# Patient Record
Sex: Female | Born: 1960
Health system: Southern US, Community
[De-identification: ages and names within clinical notes are randomized; demographics above are authoritative.]

## PROBLEM LIST (undated history)

## (undated) DIAGNOSIS — I10 Essential (primary) hypertension: Secondary | ICD-10-CM

## (undated) HISTORY — PX: EXPLORATORY LAPAROTOMY: SUR591

## (undated) HISTORY — PX: CHOLECYSTECTOMY: SHX55

---

## 2015-03-23 DIAGNOSIS — I639 Cerebral infarction, unspecified: Secondary | ICD-10-CM

## 2015-03-23 HISTORY — DX: Cerebral infarction, unspecified: I63.9

## 2016-07-05 DIAGNOSIS — H8110 Benign paroxysmal vertigo, unspecified ear: Secondary | ICD-10-CM | POA: Diagnosis not present

## 2016-07-05 DIAGNOSIS — Z8673 Personal history of transient ischemic attack (TIA), and cerebral infarction without residual deficits: Secondary | ICD-10-CM | POA: Diagnosis not present

## 2016-07-05 DIAGNOSIS — R42 Dizziness and giddiness: Secondary | ICD-10-CM | POA: Diagnosis not present

## 2016-07-05 DIAGNOSIS — Z1159 Encounter for screening for other viral diseases: Secondary | ICD-10-CM | POA: Diagnosis not present

## 2016-07-05 DIAGNOSIS — Z0000 Encounter for general adult medical examination without abnormal findings: Secondary | ICD-10-CM | POA: Diagnosis not present

## 2016-07-16 DIAGNOSIS — Z8673 Personal history of transient ischemic attack (TIA), and cerebral infarction without residual deficits: Secondary | ICD-10-CM | POA: Diagnosis not present

## 2016-07-16 DIAGNOSIS — N63 Unspecified lump in unspecified breast: Secondary | ICD-10-CM | POA: Diagnosis not present

## 2016-07-16 DIAGNOSIS — N6321 Unspecified lump in the left breast, upper outer quadrant: Secondary | ICD-10-CM | POA: Diagnosis not present

## 2016-07-16 DIAGNOSIS — H8110 Benign paroxysmal vertigo, unspecified ear: Secondary | ICD-10-CM | POA: Diagnosis not present

## 2016-07-16 DIAGNOSIS — Z1231 Encounter for screening mammogram for malignant neoplasm of breast: Secondary | ICD-10-CM | POA: Diagnosis not present

## 2016-07-16 DIAGNOSIS — R922 Inconclusive mammogram: Secondary | ICD-10-CM | POA: Diagnosis not present

## 2016-10-11 DIAGNOSIS — I10 Essential (primary) hypertension: Secondary | ICD-10-CM | POA: Diagnosis not present

## 2016-10-11 DIAGNOSIS — H6092 Unspecified otitis externa, left ear: Secondary | ICD-10-CM | POA: Diagnosis not present

## 2016-10-11 DIAGNOSIS — R42 Dizziness and giddiness: Secondary | ICD-10-CM | POA: Diagnosis not present

## 2016-10-11 DIAGNOSIS — Z23 Encounter for immunization: Secondary | ICD-10-CM | POA: Diagnosis not present

## 2016-10-25 DIAGNOSIS — K5900 Constipation, unspecified: Secondary | ICD-10-CM | POA: Diagnosis not present

## 2016-10-25 DIAGNOSIS — Z1211 Encounter for screening for malignant neoplasm of colon: Secondary | ICD-10-CM | POA: Diagnosis not present

## 2016-11-12 DIAGNOSIS — Z1211 Encounter for screening for malignant neoplasm of colon: Secondary | ICD-10-CM | POA: Diagnosis not present

## 2016-11-12 DIAGNOSIS — K5730 Diverticulosis of large intestine without perforation or abscess without bleeding: Secondary | ICD-10-CM | POA: Diagnosis not present

## 2016-11-12 DIAGNOSIS — K648 Other hemorrhoids: Secondary | ICD-10-CM | POA: Diagnosis not present

## 2017-01-14 DIAGNOSIS — N6002 Solitary cyst of left breast: Secondary | ICD-10-CM | POA: Diagnosis not present

## 2017-01-14 DIAGNOSIS — N63 Unspecified lump in unspecified breast: Secondary | ICD-10-CM | POA: Diagnosis not present

## 2017-05-06 DIAGNOSIS — G44309 Post-traumatic headache, unspecified, not intractable: Secondary | ICD-10-CM | POA: Diagnosis not present

## 2017-05-06 DIAGNOSIS — J329 Chronic sinusitis, unspecified: Secondary | ICD-10-CM | POA: Diagnosis not present

## 2017-05-06 DIAGNOSIS — J40 Bronchitis, not specified as acute or chronic: Secondary | ICD-10-CM | POA: Diagnosis not present

## 2017-09-28 DIAGNOSIS — G44309 Post-traumatic headache, unspecified, not intractable: Secondary | ICD-10-CM | POA: Diagnosis not present

## 2017-09-28 DIAGNOSIS — I10 Essential (primary) hypertension: Secondary | ICD-10-CM | POA: Diagnosis not present

## 2017-09-28 DIAGNOSIS — E785 Hyperlipidemia, unspecified: Secondary | ICD-10-CM | POA: Diagnosis not present

## 2017-11-08 DIAGNOSIS — I10 Essential (primary) hypertension: Secondary | ICD-10-CM | POA: Diagnosis not present

## 2017-11-08 DIAGNOSIS — R05 Cough: Secondary | ICD-10-CM | POA: Diagnosis not present

## 2017-11-08 DIAGNOSIS — E785 Hyperlipidemia, unspecified: Secondary | ICD-10-CM | POA: Diagnosis not present

## 2017-12-28 DIAGNOSIS — G44309 Post-traumatic headache, unspecified, not intractable: Secondary | ICD-10-CM | POA: Diagnosis not present

## 2018-01-25 DIAGNOSIS — G44309 Post-traumatic headache, unspecified, not intractable: Secondary | ICD-10-CM | POA: Diagnosis not present

## 2020-12-12 ENCOUNTER — Other Ambulatory Visit (HOSPITAL_COMMUNITY): Payer: Self-pay | Admitting: Registered Nurse

## 2020-12-12 ENCOUNTER — Inpatient Hospital Stay (HOSPITAL_BASED_OUTPATIENT_CLINIC_OR_DEPARTMENT_OTHER)
Admission: EM | Admit: 2020-12-12 | Discharge: 2020-12-16 | DRG: 041 | Disposition: A | Discharge status: Home / Self Care | Payer: Commercial Managed Care - PPO | Attending: Internal Medicine | Admitting: Internal Medicine

## 2020-12-12 ENCOUNTER — Other Ambulatory Visit (HOSPITAL_COMMUNITY): Payer: Self-pay | Admitting: Family Medicine

## 2020-12-12 ENCOUNTER — Encounter (HOSPITAL_BASED_OUTPATIENT_CLINIC_OR_DEPARTMENT_OTHER): Payer: Self-pay

## 2020-12-12 ENCOUNTER — Ambulatory Visit (HOSPITAL_BASED_OUTPATIENT_CLINIC_OR_DEPARTMENT_OTHER)
Admission: RE | Admit: 2020-12-12 | Discharge: 2020-12-12 | Disposition: A | Discharge status: Home / Self Care | Payer: Commercial Managed Care - PPO | Source: Ambulatory Visit | Attending: Family Medicine | Admitting: Family Medicine

## 2020-12-12 ENCOUNTER — Other Ambulatory Visit: Payer: Self-pay

## 2020-12-12 DIAGNOSIS — I4891 Unspecified atrial fibrillation: Secondary | ICD-10-CM | POA: Diagnosis present

## 2020-12-12 DIAGNOSIS — R29818 Other symptoms and signs involving the nervous system: Secondary | ICD-10-CM

## 2020-12-12 DIAGNOSIS — I371 Nonrheumatic pulmonary valve insufficiency: Secondary | ICD-10-CM | POA: Diagnosis present

## 2020-12-12 DIAGNOSIS — I10 Essential (primary) hypertension: Secondary | ICD-10-CM | POA: Diagnosis present

## 2020-12-12 DIAGNOSIS — Z20822 Contact with and (suspected) exposure to covid-19: Secondary | ICD-10-CM | POA: Diagnosis present

## 2020-12-12 DIAGNOSIS — Z7902 Long term (current) use of antithrombotics/antiplatelets: Secondary | ICD-10-CM

## 2020-12-12 DIAGNOSIS — E669 Obesity, unspecified: Secondary | ICD-10-CM | POA: Diagnosis present

## 2020-12-12 DIAGNOSIS — Z6836 Body mass index (BMI) 36.0-36.9, adult: Secondary | ICD-10-CM

## 2020-12-12 DIAGNOSIS — Z800 Family history of malignant neoplasm of digestive organs: Secondary | ICD-10-CM

## 2020-12-12 DIAGNOSIS — G8191 Hemiplegia, unspecified affecting right dominant side: Secondary | ICD-10-CM | POA: Diagnosis present

## 2020-12-12 DIAGNOSIS — I351 Nonrheumatic aortic (valve) insufficiency: Secondary | ICD-10-CM | POA: Diagnosis present

## 2020-12-12 DIAGNOSIS — R29701 NIHSS score 1: Secondary | ICD-10-CM | POA: Diagnosis present

## 2020-12-12 DIAGNOSIS — E785 Hyperlipidemia, unspecified: Secondary | ICD-10-CM | POA: Diagnosis present

## 2020-12-12 DIAGNOSIS — G8929 Other chronic pain: Secondary | ICD-10-CM | POA: Diagnosis present

## 2020-12-12 DIAGNOSIS — Z7982 Long term (current) use of aspirin: Secondary | ICD-10-CM

## 2020-12-12 DIAGNOSIS — I63422 Cerebral infarction due to embolism of left anterior cerebral artery: Secondary | ICD-10-CM | POA: Diagnosis not present

## 2020-12-12 DIAGNOSIS — Q211 Atrial septal defect: Secondary | ICD-10-CM

## 2020-12-12 DIAGNOSIS — Z8673 Personal history of transient ischemic attack (TIA), and cerebral infarction without residual deficits: Secondary | ICD-10-CM

## 2020-12-12 DIAGNOSIS — Z9049 Acquired absence of other specified parts of digestive tract: Secondary | ICD-10-CM

## 2020-12-12 DIAGNOSIS — E119 Type 2 diabetes mellitus without complications: Secondary | ICD-10-CM | POA: Diagnosis present

## 2020-12-12 DIAGNOSIS — Z713 Dietary counseling and surveillance: Secondary | ICD-10-CM

## 2020-12-12 DIAGNOSIS — I358 Other nonrheumatic aortic valve disorders: Secondary | ICD-10-CM | POA: Diagnosis present

## 2020-12-12 DIAGNOSIS — I639 Cerebral infarction, unspecified: Secondary | ICD-10-CM | POA: Diagnosis not present

## 2020-12-12 HISTORY — DX: Essential (primary) hypertension: I10

## 2020-12-12 LAB — BASIC METABOLIC PANEL
Anion gap: 10 (ref 5–15)
BUN: 12 mg/dL (ref 8–23)
CO2: 25 mmol/L (ref 22–32)
Calcium: 9.9 mg/dL (ref 8.9–10.3)
Chloride: 103 mmol/L (ref 98–111)
Creatinine, Ser: 0.67 mg/dL (ref 0.44–1.00)
GFR, Estimated: >60 mL/min (ref >60)
Glucose, Bld: 88 mg/dL (ref 70–99)
Potassium: 3.9 mmol/L (ref 3.5–5.1)
Sodium: 138 mmol/L (ref 135–145)

## 2020-12-12 LAB — CBC WITH DIFFERENTIAL/PLATELET
Abs Immature Granulocytes: 0.04 10*3/uL (ref 0.00–0.07)
Basophils Absolute: 0.1 10*3/uL (ref 0.0–0.1)
Basophils Relative: 1 %
Eosinophils Absolute: 0.2 10*3/uL (ref 0.0–0.5)
Eosinophils Relative: 2 %
HCT: 43.6 % (ref 36.0–46.0)
Hemoglobin: 14.4 g/dL (ref 12.0–15.0)
Immature Granulocytes: 0 %
Lymphocytes Relative: 33 %
Lymphs Abs: 3.5 10*3/uL (ref 0.7–4.0)
MCH: 29 pg (ref 26.0–34.0)
MCHC: 33 g/dL (ref 30.0–36.0)
MCV: 87.7 fL (ref 80.0–100.0)
Monocytes Absolute: 0.6 10*3/uL (ref 0.1–1.0)
Monocytes Relative: 6 %
Neutro Abs: 6.2 10*3/uL (ref 1.7–7.7)
Neutrophils Relative %: 58 %
Platelets: 317 10*3/uL (ref 150–400)
RBC: 4.97 MIL/uL (ref 3.87–5.11)
RDW: 12.7 % (ref 11.5–15.5)
WBC: 10.6 10*3/uL — ABNORMAL HIGH (ref 4.0–10.5)
nRBC: 0 % (ref 0.0–0.2)

## 2020-12-12 LAB — RESP PANEL BY RT-PCR (FLU A&B, COVID) ARPGX2
Influenza A by PCR: NEGATIVE
Influenza B by PCR: NEGATIVE
SARS Coronavirus 2 by RT PCR: NEGATIVE

## 2020-12-12 IMAGING — CT CT HEAD W/O CM
4 series · 16 of 47 positions shown, 18 images · non-contrast
Comparison: None.

CLINICAL DATA: Right arm and leg numbness and heaviness for 1
month, increasingly frequent episodes

EXAM:
CT HEAD WITHOUT CONTRAST
TECHNIQUE: Contiguous axial images were obtained from the base of the skull
through the vertex without intravenous contrast.

[Series 2: head wo · axial · 0.41mm/px · z∈[-218,-103]mm · 7 of 31 slices shown, 9 images]
[im 4/31  brain]
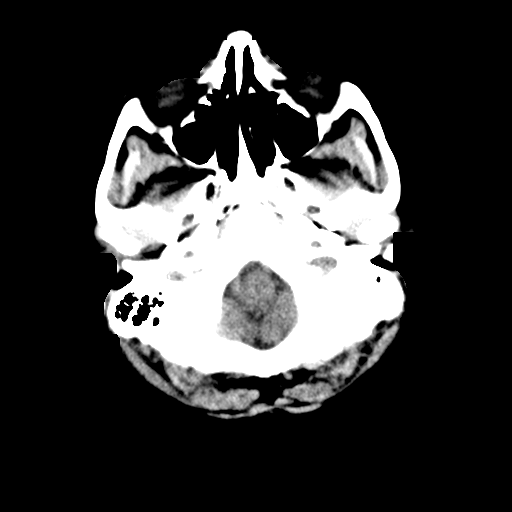
[im 4/31  bone]
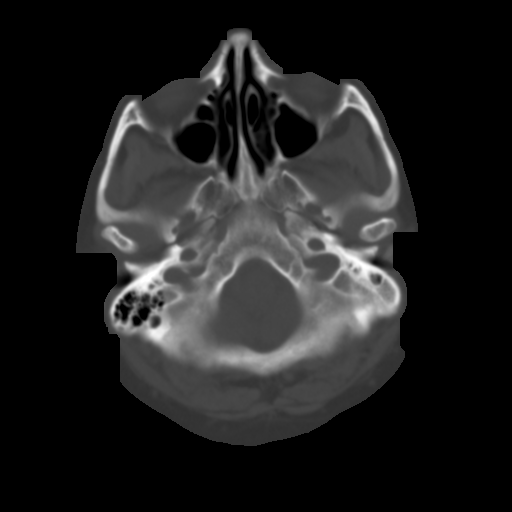
[im 8/31  brain]
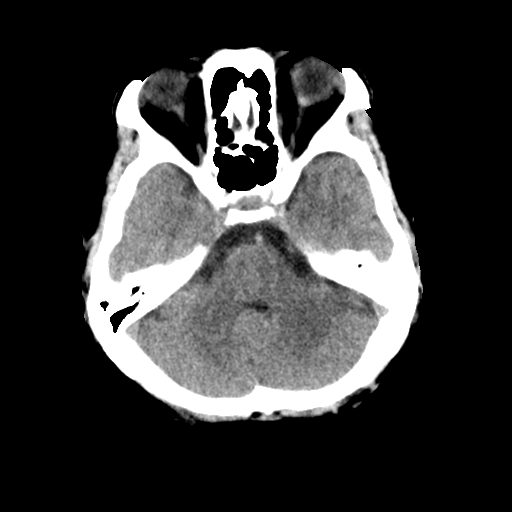
[im 12/31  brain]
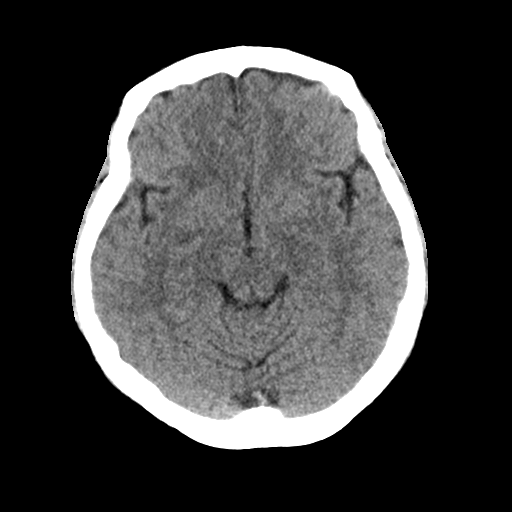
[im 16/31  brain]
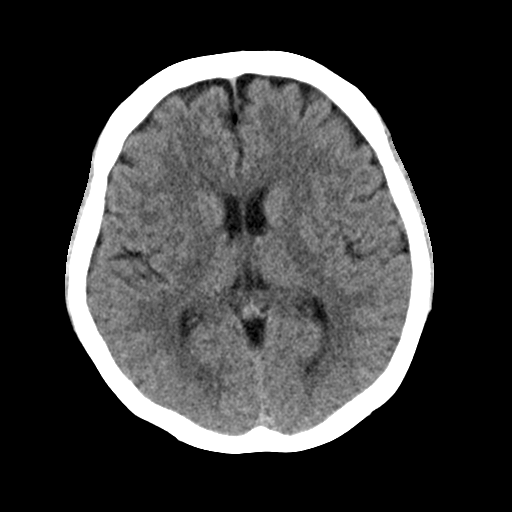
[im 19/31  brain]
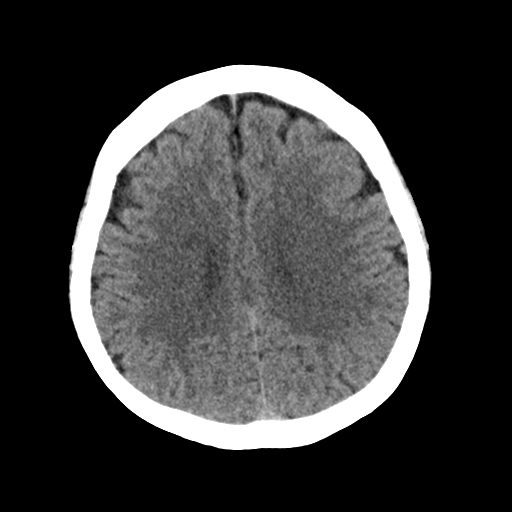
[im 19/31  bone]
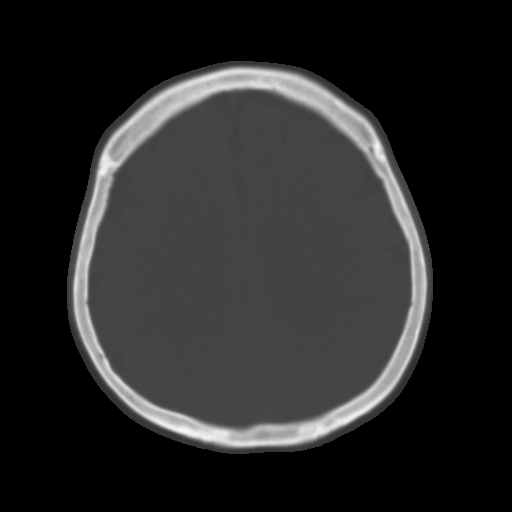
[im 23/31  brain]
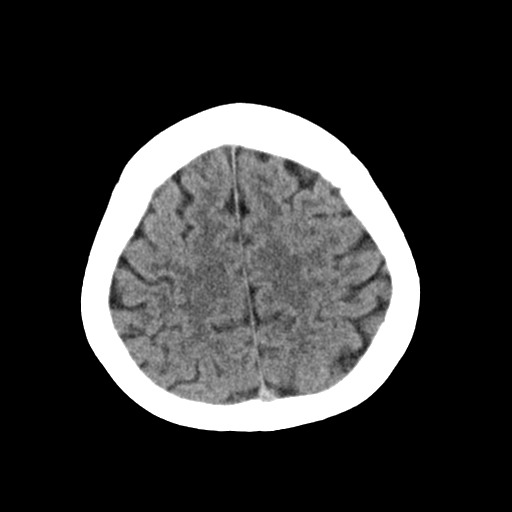
[im 27/31  brain]
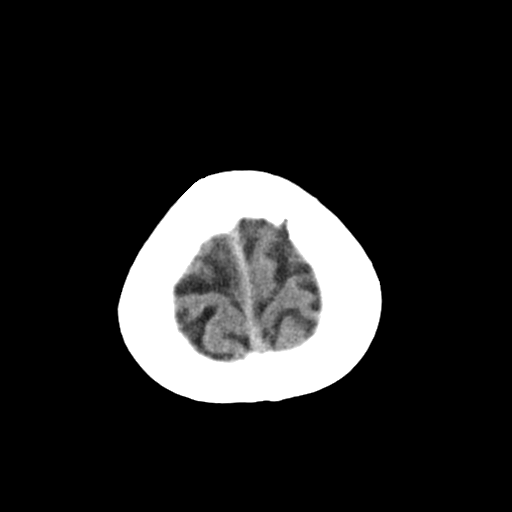

[Series 3: head bone · axial · 0.41mm/px · z∈[-219,-189]mm · 3 of 77 slices shown]
[im 8/77  bone]
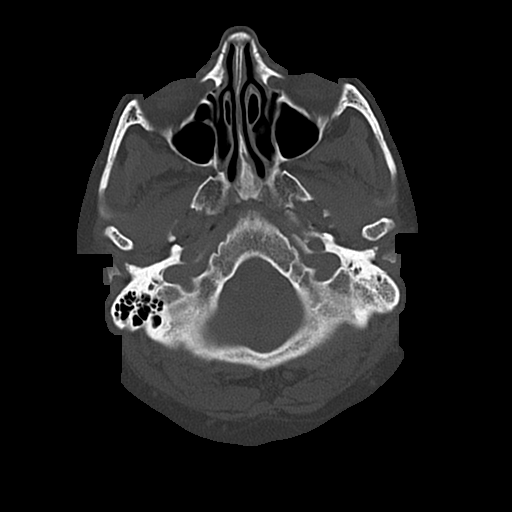
[im 16/77  bone]
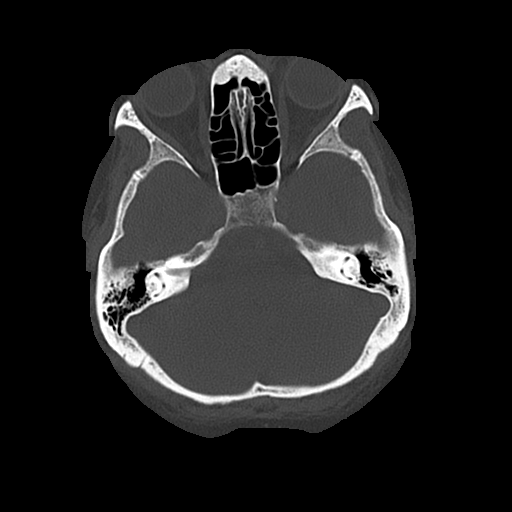
[im 23/77  bone]
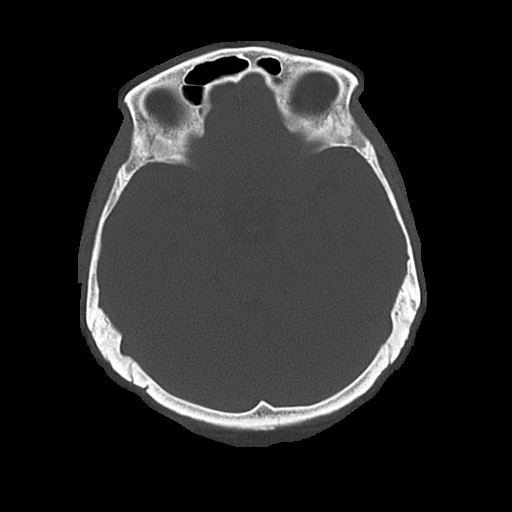

[Series 4: coronal soft · coronal · 0.30mm/px · 3 of 61 slices shown]
[im 21/61  brain]
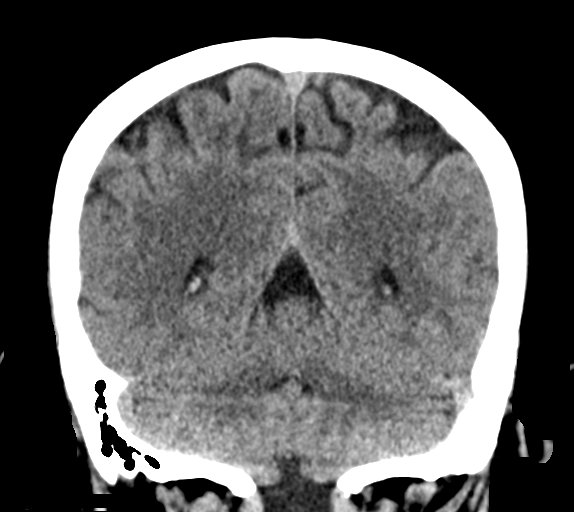
[im 27/61  brain]
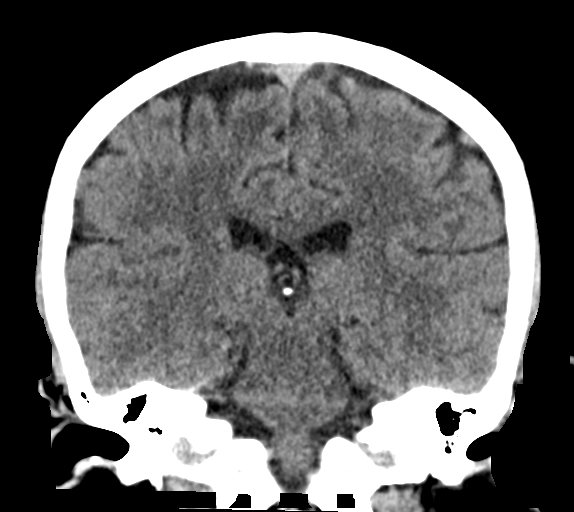
[im 34/61  brain]
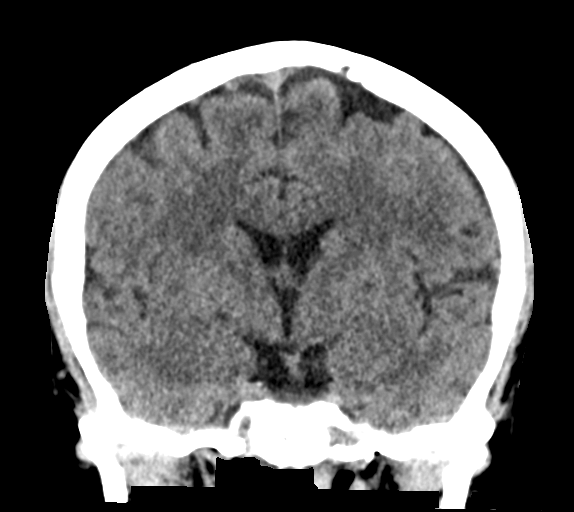

[Series 5: sagittal soft · sagittal · 0.31mm/px · 3 of 59 slices shown]
[im 20/59  brain]
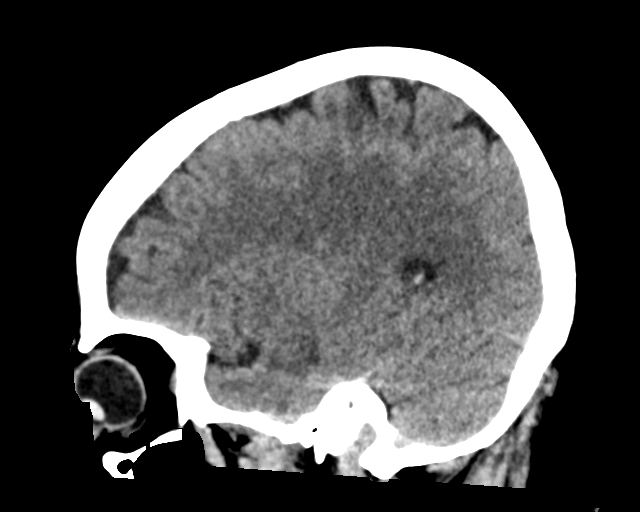
[im 30/59  brain]
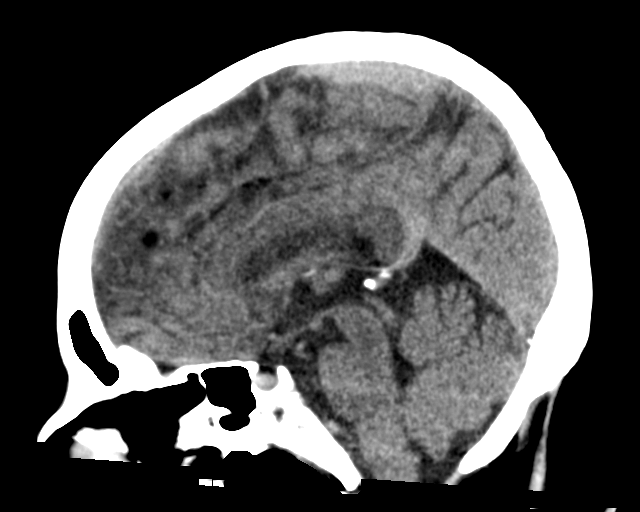
[im 39/59  brain]
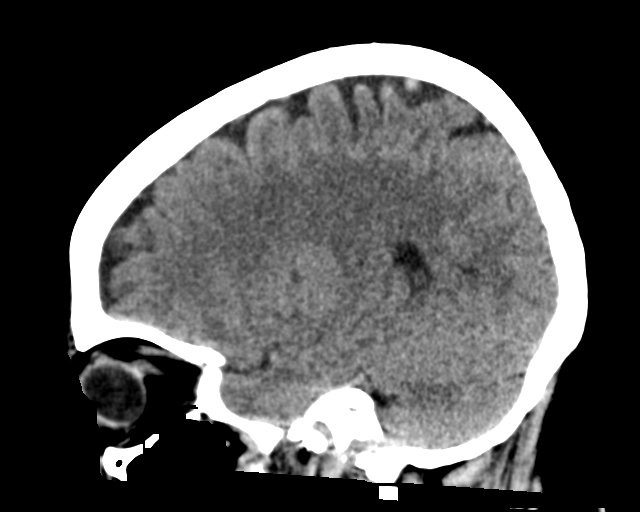

[16 of 47 positions shown; findings below may reference images not displayed]

FINDINGS: Brain: No evidence of acute infarction, hemorrhage, hydrocephalus,
extra-axial collection or mass lesion/mass effect. Small, likely
nonacute lacunar infarction of the right corona radiata (series 2,
image 18)

Vascular: No hyperdense vessel or unexpected calcification.

Skull: Normal. Negative for fracture or focal lesion.

Sinuses/Orbits: No acute finding.

Other: None.
IMPRESSION: 1. No acute intracranial pathology.
2. Small, likely nonacute lacunar infarction of the right corona
radiata.
3. Consider MRI to more sensitively evaluate for acute diffusion
restricting infarction if clinically suspected.

These results will be called to the ordering clinician or
representative by the Radiologist Assistant, and communication
documented in the PACS or [REDACTED].

## 2020-12-12 MED ORDER — ASPIRIN 81 MG PO CHEW
162.0000 mg | CHEWABLE_TABLET | Freq: Once | ORAL | Status: AC
  Administered 2020-12-12: 162 mg via ORAL
  Filled 2020-12-12: qty 2

## 2020-12-12 NOTE — ED Triage Notes (Signed)
Pt arrives POV with c/o numbness to right arm and right leg from knee down.  States numbness is intermittent and she has had it "awhile".  Pt was sent by her provider for CT of head.

## 2020-12-12 NOTE — ED Provider Notes (Signed)
[facility] EMERGENCY DEPT Provider Note   CSN: [id number] Arrival date & time: 12/12/20  1503     History Chief Complaint  Patient presents with   Numbness    Mary Fritz is a 60 y.o. female.  Patient presents with weakness and tingling to the right upper extremity and right lower extremity.  Symptoms been ongoing for the last 5 days.  She saw her primary care doctor who ordered an outpatient CT scan of the brain that was done today.  Otherwise denies any fevers or cough no vomiting or diarrhea.  Denies any chest pain or abdominal pain.      Past Medical History:  Diagnosis Date   CVA (cerebral vascular accident) (HCC) 2017   Hypertension     There are no problems to display for this patient.   OB History   No obstetric history on file.     No family history on file.  Social History   Tobacco Use   Smoking status: Never   Smokeless tobacco: Never  Vaping Use   Vaping Use: Never used  Substance Use Topics   Alcohol use: Never   Drug use: Never    Home Medications Prior to Admission medications   Medication Sig Start Date End Date Taking? Authorizing Provider  clopidogrel (PLAVIX) 75 MG tablet Take 75 mg by mouth daily.   Yes [provider]    Allergies    Patient has no known allergies.  Review of Systems   Review of Systems  Constitutional:  Negative for fever.  HENT:  Negative for ear pain.   Eyes:  Negative for pain.  Respiratory:  Negative for cough.   Cardiovascular:  Negative for chest pain.  Gastrointestinal:  Negative for abdominal pain.  Genitourinary:  Negative for flank pain.  Musculoskeletal:  Negative for back pain.  Skin:  Negative for rash.  Neurological:  Negative for headaches.   Physical Exam Updated Vital Signs BP (!) 157/69   Pulse 60   Temp 98.5 F (36.9 C)   Resp 18   SpO2 97%   Physical Exam Constitutional:      General: She is not in acute distress.    Appearance: Normal appearance.   HENT:     Head: Normocephalic.     Nose: Nose normal.  Eyes:     Extraocular Movements: Extraocular movements intact.  Cardiovascular:     Rate and Rhythm: Normal rate.  Pulmonary:     Effort: Pulmonary effort is normal.  Musculoskeletal:        General: Normal range of motion.     Cervical back: Normal range of motion.  Neurological:     Mental Status: She is alert.     Comments: Tingling numbness sensation in the right upper extremity and right lower extremity present.  However no focal weakness noted 5/5 strength all extremities.  Cranial nerves II through XII intact.     ED Results / Procedures / Treatments   Labs (all labs ordered are listed, but only abnormal results are displayed) Labs Reviewed  CBC WITH DIFFERENTIAL/PLATELET - Abnormal; Notable for the following components:      Result Value   WBC 10.6 (*)    All other components within normal limits  RESP PANEL BY RT-PCR (FLU A&B, COVID) ARPGX2  BASIC METABOLIC PANEL    EKG None  Radiology CT HEAD WO CONTRAST ( )  Result Date: 12/12/2020 CLINICAL DATA:  Right arm and leg numbness and heaviness for 1 month, increasingly frequent episodes  EXAM: CT HEAD WITHOUT CONTRAST TECHNIQUE: Contiguous axial images were obtained from the base of the skull through the vertex without intravenous contrast. COMPARISON:  None. FINDINGS: Brain: No evidence of acute infarction, hemorrhage, hydrocephalus, extra-axial collection or mass lesion/mass effect. Small, likely nonacute lacunar infarction of the right corona radiata (series 2, image 18) Vascular: No hyperdense vessel or unexpected calcification. Skull: Normal. Negative for fracture or focal lesion. Sinuses/Orbits: No acute finding. Other: None. IMPRESSION: 1. No acute intracranial pathology. 2. Small, likely nonacute lacunar infarction of the right corona radiata. 3. Consider MRI to more sensitively evaluate for acute diffusion restricting infarction if clinically suspected. These  results will be called to the ordering clinician or representative by the Radiologist Assistant, and communication documented in the PACS or [employer]. Electronically Signed   By: Lauralyn Primes M.D.   On: 12/12/2020 14:34    Procedures Procedures   Medications Ordered in ED Medications  aspirin chewable tablet 162 mg (has no administration in time range)    ED Course  I have reviewed the triage vital signs and the nursing notes.  Pertinent labs & imaging results that were available during my care of the patient were reviewed by me and considered in my medical decision making (see chart for details).    MDM Rules/Calculators/A&P                           Labs are sent and chemistry remarkable.  CT scan concerning for right corona radiata small infarct, unknown duration thought to be likely nonacute per radiology.  Acuity of the infarct on CT scan appears in question.  Patient symptoms ongoing for about 5 days now.  Labs are sent.  Patient to be transferred to [facility] for MRI and observation.  Case discussed with neurology Dr.Collins.  Final Clinical Impression(s) / ED Diagnoses Final diagnoses:  Cerebrovascular accident (CVA), unspecified mechanism [facility])    Rx / DC Orders ED Discharge Orders     None        Cheryll Cockayne, MD 12/12/20 2018

## 2020-12-13 ENCOUNTER — Observation Stay (HOSPITAL_COMMUNITY): Payer: Commercial Managed Care - PPO

## 2020-12-13 ENCOUNTER — Encounter (HOSPITAL_COMMUNITY): Payer: Self-pay | Admitting: Internal Medicine

## 2020-12-13 DIAGNOSIS — I639 Cerebral infarction, unspecified: Secondary | ICD-10-CM

## 2020-12-13 DIAGNOSIS — Z800 Family history of malignant neoplasm of digestive organs: Secondary | ICD-10-CM | POA: Diagnosis not present

## 2020-12-13 DIAGNOSIS — Z8673 Personal history of transient ischemic attack (TIA), and cerebral infarction without residual deficits: Secondary | ICD-10-CM | POA: Diagnosis not present

## 2020-12-13 DIAGNOSIS — Z7982 Long term (current) use of aspirin: Secondary | ICD-10-CM | POA: Diagnosis not present

## 2020-12-13 DIAGNOSIS — Z7902 Long term (current) use of antithrombotics/antiplatelets: Secondary | ICD-10-CM | POA: Diagnosis not present

## 2020-12-13 DIAGNOSIS — G8929 Other chronic pain: Secondary | ICD-10-CM | POA: Diagnosis not present

## 2020-12-13 DIAGNOSIS — Z6836 Body mass index (BMI) 36.0-36.9, adult: Secondary | ICD-10-CM | POA: Diagnosis not present

## 2020-12-13 DIAGNOSIS — Z713 Dietary counseling and surveillance: Secondary | ICD-10-CM | POA: Diagnosis not present

## 2020-12-13 DIAGNOSIS — I371 Nonrheumatic pulmonary valve insufficiency: Secondary | ICD-10-CM | POA: Diagnosis not present

## 2020-12-13 DIAGNOSIS — R29701 NIHSS score 1: Secondary | ICD-10-CM | POA: Diagnosis not present

## 2020-12-13 DIAGNOSIS — I63422 Cerebral infarction due to embolism of left anterior cerebral artery: Secondary | ICD-10-CM | POA: Diagnosis not present

## 2020-12-13 DIAGNOSIS — Z9049 Acquired absence of other specified parts of digestive tract: Secondary | ICD-10-CM | POA: Diagnosis not present

## 2020-12-13 DIAGNOSIS — I351 Nonrheumatic aortic (valve) insufficiency: Secondary | ICD-10-CM | POA: Diagnosis not present

## 2020-12-13 DIAGNOSIS — I10 Essential (primary) hypertension: Secondary | ICD-10-CM | POA: Diagnosis not present

## 2020-12-13 DIAGNOSIS — E119 Type 2 diabetes mellitus without complications: Secondary | ICD-10-CM | POA: Diagnosis not present

## 2020-12-13 DIAGNOSIS — I358 Other nonrheumatic aortic valve disorders: Secondary | ICD-10-CM | POA: Diagnosis not present

## 2020-12-13 DIAGNOSIS — I4891 Unspecified atrial fibrillation: Secondary | ICD-10-CM | POA: Diagnosis not present

## 2020-12-13 DIAGNOSIS — E669 Obesity, unspecified: Secondary | ICD-10-CM | POA: Diagnosis not present

## 2020-12-13 DIAGNOSIS — R200 Anesthesia of skin: Secondary | ICD-10-CM

## 2020-12-13 DIAGNOSIS — G8191 Hemiplegia, unspecified affecting right dominant side: Secondary | ICD-10-CM | POA: Diagnosis not present

## 2020-12-13 DIAGNOSIS — E785 Hyperlipidemia, unspecified: Secondary | ICD-10-CM | POA: Diagnosis not present

## 2020-12-13 DIAGNOSIS — Z20822 Contact with and (suspected) exposure to covid-19: Secondary | ICD-10-CM | POA: Diagnosis not present

## 2020-12-13 DIAGNOSIS — Q211 Atrial septal defect: Secondary | ICD-10-CM | POA: Diagnosis not present

## 2020-12-13 LAB — LIPID PANEL
Cholesterol: 227 mg/dL — ABNORMAL HIGH (ref 0–200)
HDL: 42 mg/dL (ref >40)
LDL Cholesterol: 161 mg/dL — ABNORMAL HIGH (ref 0–99)
Total CHOL/HDL Ratio: 5.4 RATIO
Triglycerides: 122 mg/dL (ref <150)
VLDL: 24 mg/dL (ref 0–40)

## 2020-12-13 LAB — CBC
HCT: 39.4 % (ref 36.0–46.0)
Hemoglobin: 13.2 g/dL (ref 12.0–15.0)
MCH: 29.9 pg (ref 26.0–34.0)
MCHC: 33.5 g/dL (ref 30.0–36.0)
MCV: 89.3 fL (ref 80.0–100.0)
Platelets: 292 10*3/uL (ref 150–400)
RBC: 4.41 MIL/uL (ref 3.87–5.11)
RDW: 12.8 % (ref 11.5–15.5)
WBC: 8.3 10*3/uL (ref 4.0–10.5)
nRBC: 0 % (ref 0.0–0.2)

## 2020-12-13 LAB — COMPREHENSIVE METABOLIC PANEL
ALT: 23 U/L (ref 0–44)
AST: 28 U/L (ref 15–41)
Albumin: 3.5 g/dL (ref 3.5–5.0)
Alkaline Phosphatase: 53 U/L (ref 38–126)
Anion gap: 7 (ref 5–15)
BUN: 10 mg/dL (ref 8–23)
CO2: 26 mmol/L (ref 22–32)
Calcium: 9 mg/dL (ref 8.9–10.3)
Chloride: 104 mmol/L (ref 98–111)
Creatinine, Ser: 0.74 mg/dL (ref 0.44–1.00)
GFR, Estimated: >60 mL/min (ref >60)
Glucose, Bld: 109 mg/dL — ABNORMAL HIGH (ref 70–99)
Potassium: 4 mmol/L (ref 3.5–5.1)
Sodium: 137 mmol/L (ref 135–145)
Total Bilirubin: 0.9 mg/dL (ref 0.3–1.2)
Total Protein: 7 g/dL (ref 6.5–8.1)

## 2020-12-13 LAB — RAPID URINE DRUG SCREEN, HOSP PERFORMED
Amphetamines: NOT DETECTED
Barbiturates: POSITIVE — AB
Benzodiazepines: NOT DETECTED
Cocaine: NOT DETECTED
Opiates: NOT DETECTED
Tetrahydrocannabinol: NOT DETECTED

## 2020-12-13 LAB — HEMOGLOBIN A1C
Hgb A1c MFr Bld: 5.7 % — ABNORMAL HIGH (ref 4.8–5.6)
Mean Plasma Glucose: 116.89 mg/dL

## 2020-12-13 LAB — HIV ANTIBODY (ROUTINE TESTING W REFLEX): HIV Screen 4th Generation wRfx: NONREACTIVE

## 2020-12-13 IMAGING — MR MR MRA NECK WO/W CM
4 of 7 series · 19 of 48 positions shown · IV contrast (Yes GAD)
Comparison: No pertinent prior exams available for comparison.

CLINICAL DATA: Neuro deficit, acute, stroke suspected.

EXAM:
MRA NECK WITHOUT AND WITH CONTRAST
TECHNIQUE: Multiplanar and multiecho pulse sequences of the neck were obtained
without and with intravenous contrast. Angiographic images of the
neck were obtained using MRA technique without and with intravenous
contrast.
CONTRAST:  8.5mL GADAVIST GADOBUTROL 1 MMOL/ML IV SOLN

[Series 1800: cor cemra ft · coronal · 1.2mm · 0.59mm/px · 7 of 104 slices shown]
[im 1/104]
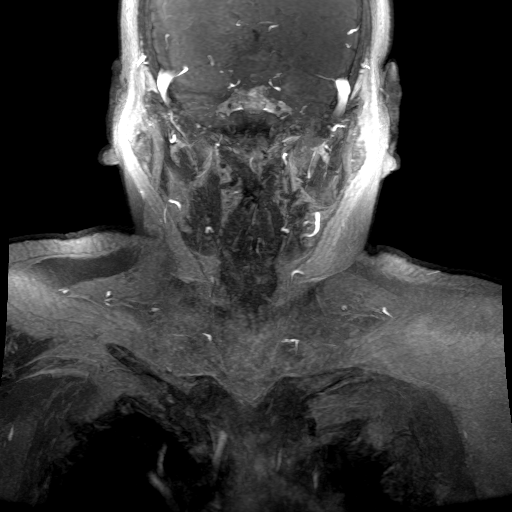
[im 18/104]
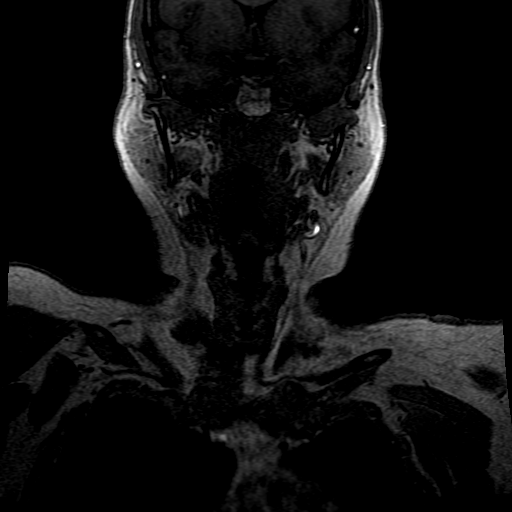
[im 35/104]
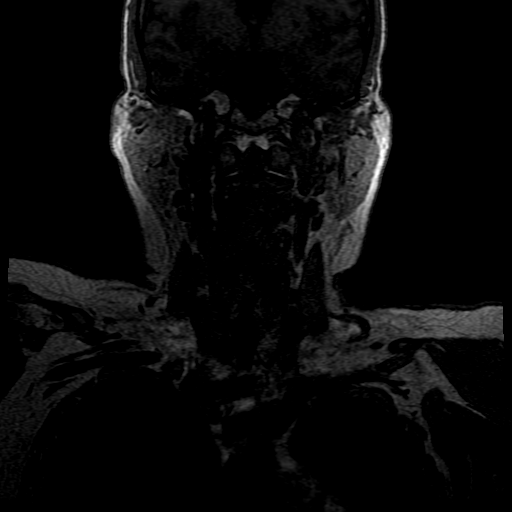
[im 52/104]
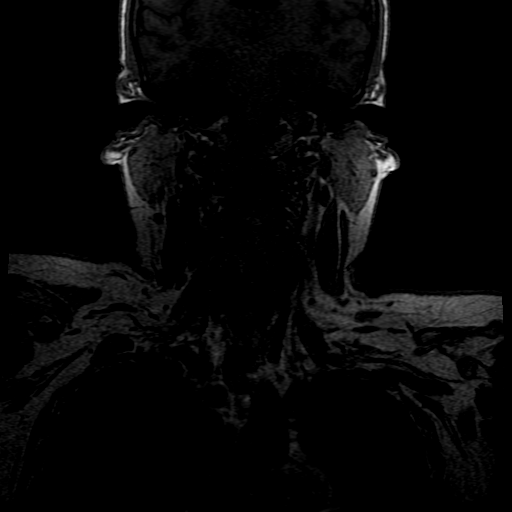
[im 69/104]
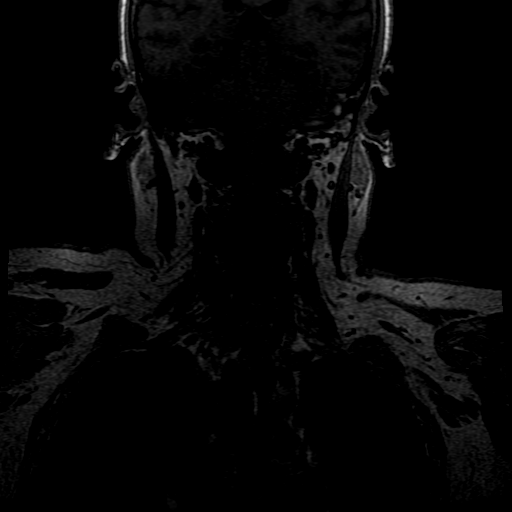
[im 86/104]
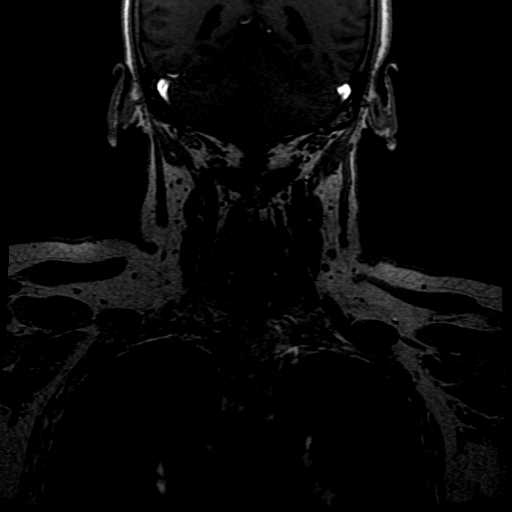
[im 104/104]
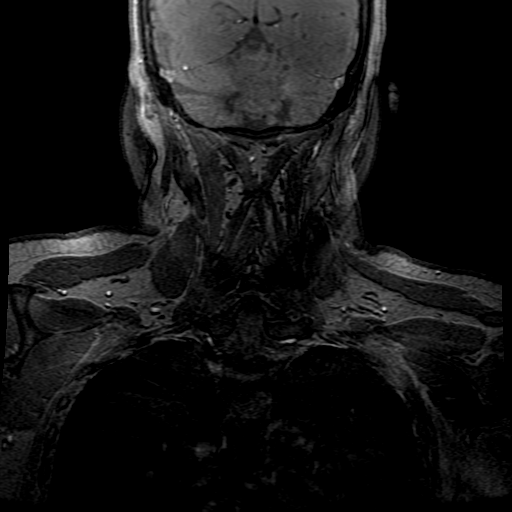

[Series 1801: ph1/cor cemra ft · coronal · 1.2mm · 0.59mm/px · 6 of 101 slices shown]
[im 1/101]
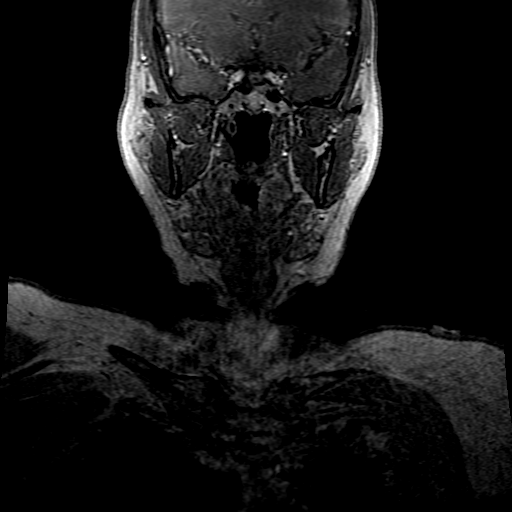
[im 17/101]
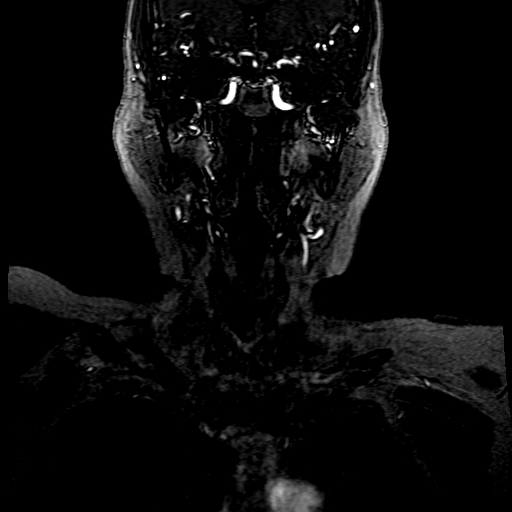
[im 34/101]
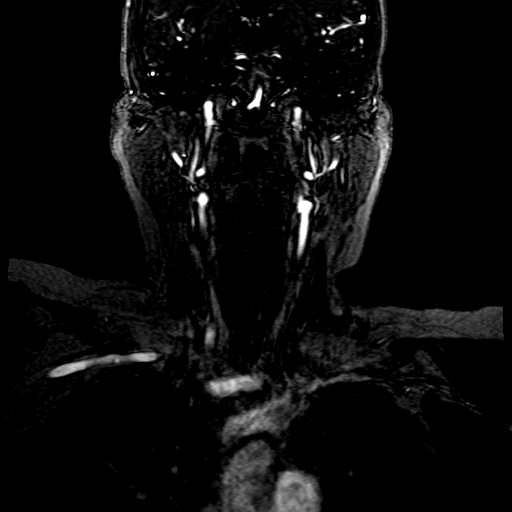
[im 51/101]
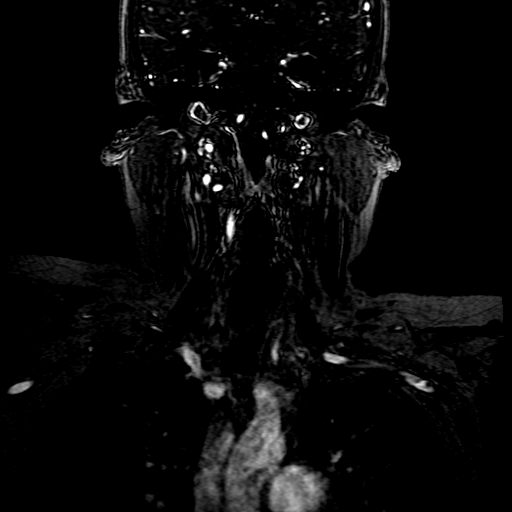
[im 67/101]
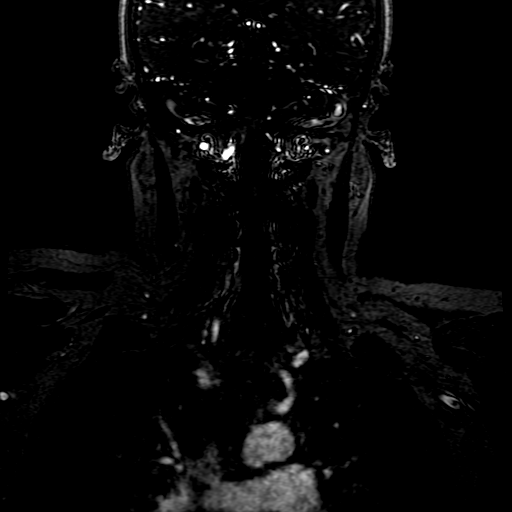
[im 84/101]
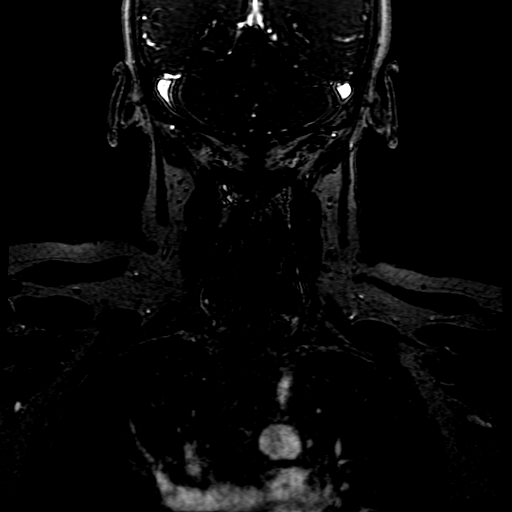

[Series 1802: ph2/cor cemra ft · coronal · 1.2mm · 0.59mm/px · 3 of 104 slices shown]
[im 18/104]
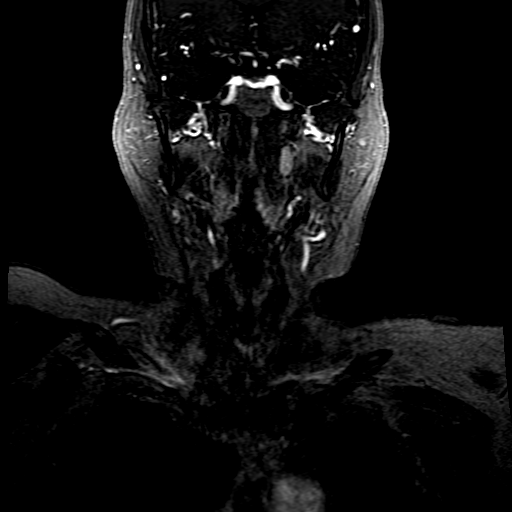
[im 52/104]
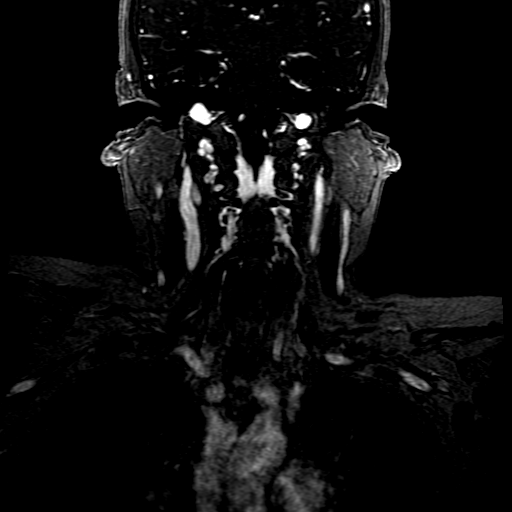
[im 86/104]
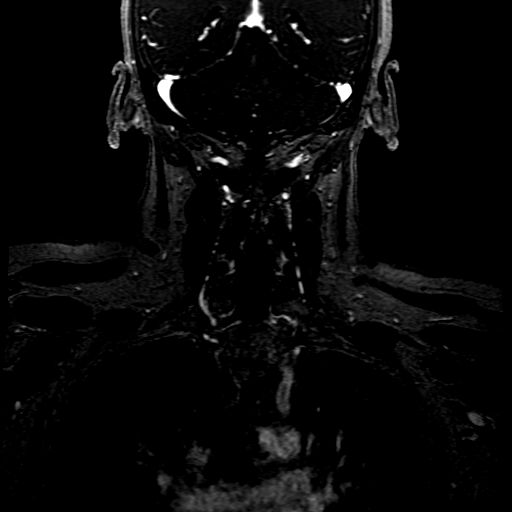

[((date))-((date)) · coronal · 1.2mm · 0.59mm/px · 3 of 105 slices shown]
[im 18/105]
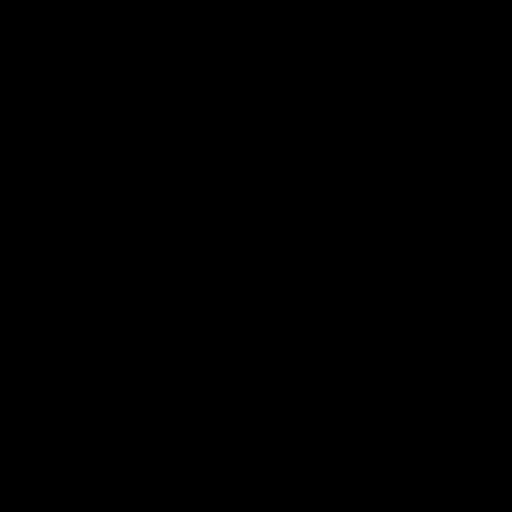
[im 53/105]
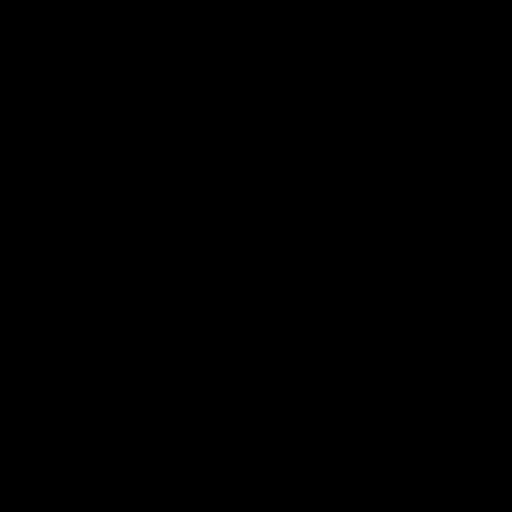
[im 87/105]
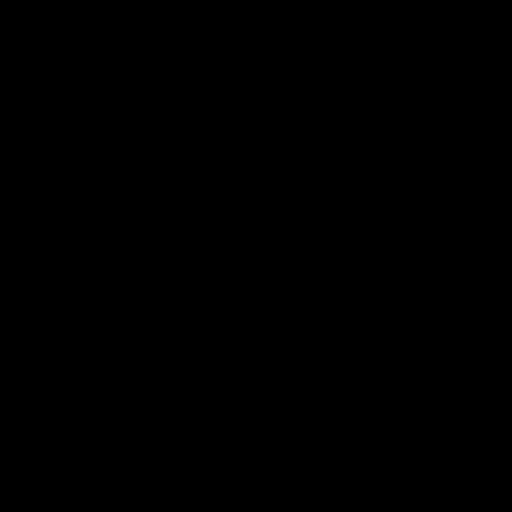

[19 of 48 positions shown; findings below may reference images not displayed]

FINDINGS: The examination is intermittently motion degraded, limiting
evaluation.

Common origin of the innominate and left common carotid arteries.
The common carotid and internal carotid arteries are patent within
the neck without hemodynamically significant stenosis (50% or
greater). Mild atherosclerotic irregularity about the carotid
bifurcations bilaterally.

The vertebral arteries are patent within the neck. The right
vertebral artery is dominant. Multiple sites of severe stenosis
within the V1 and V2 left vertebral artery.
IMPRESSION: Motion degraded examination.

The common and internal carotid arteries are patent within the neck
bilaterally without hemodynamically significant stenosis. Mild
atherosclerotic irregularity about the carotid bifurcations,
bilaterally.

Vertebral arteries patent within the neck. The right vertebral
artery is dominant. Multiple sites of severe stenosis within the non
dominant V1 and V2 left vertebral artery.

## 2020-12-13 IMAGING — MR MR MRA HEAD W/O CM
2 series · 19 of 48 positions shown · non-contrast
Comparison: Concurrently performed brain MRI [DATE].

CLINICAL DATA: Neuro deficit, acute, stroke suspected

EXAM:
MRA HEAD WITHOUT CONTRAST
TECHNIQUE: Angiographic images of the Circle of Willis were acquired using MRA
technique without intravenous contrast.

[Series 2: ax (id) · axial · 1.0mm · 0.43mm/px · z∈[-97,-14]mm · 18 of 176 slices shown]
[im 1/176]
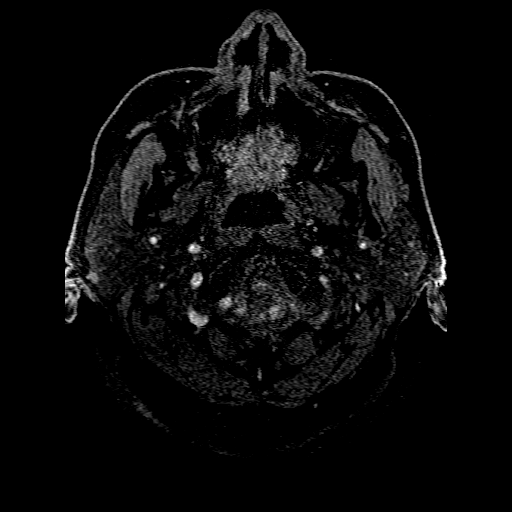
[im 4/176]
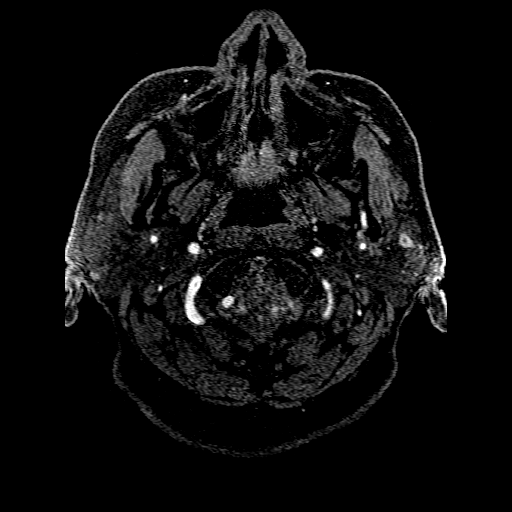
[im 8/176]
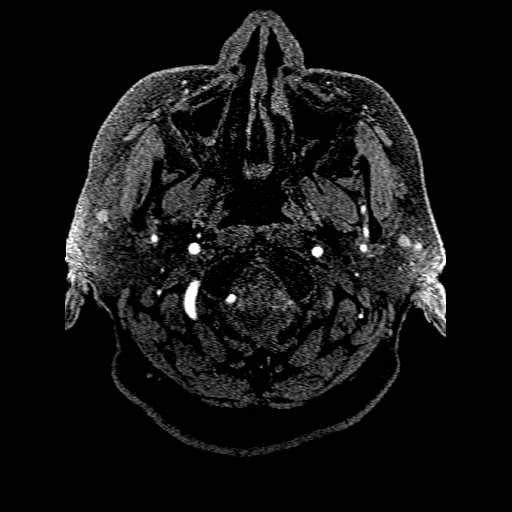
[im 12/176]
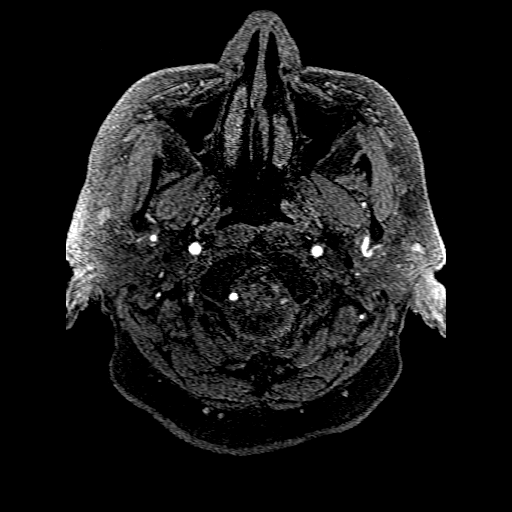
[im 16/176]
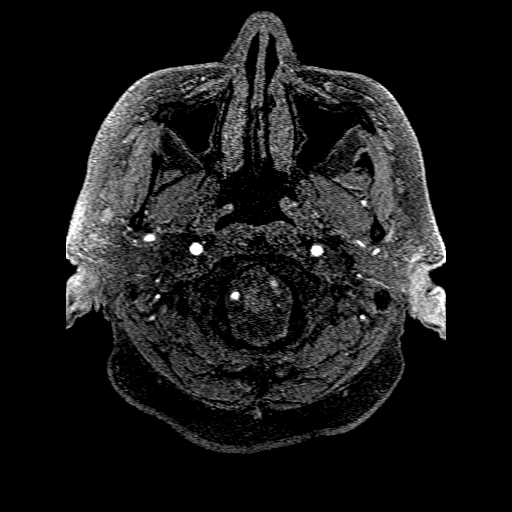
[im 20/176]
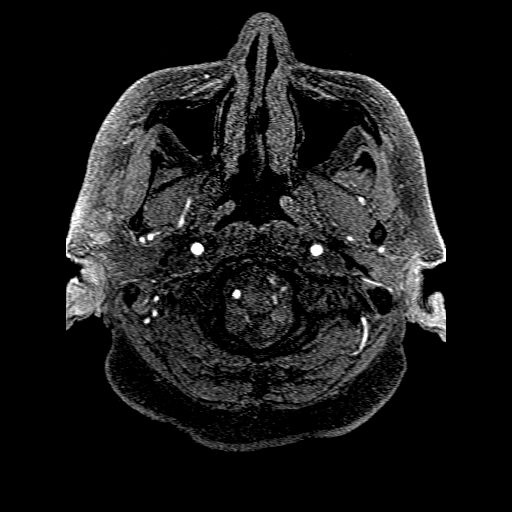
[im 23/176]
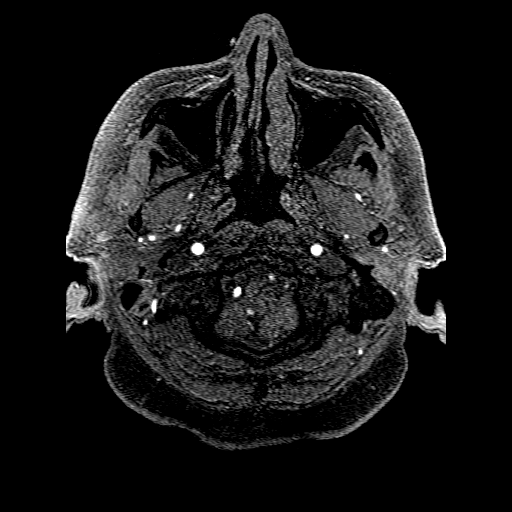
[im 27/176]
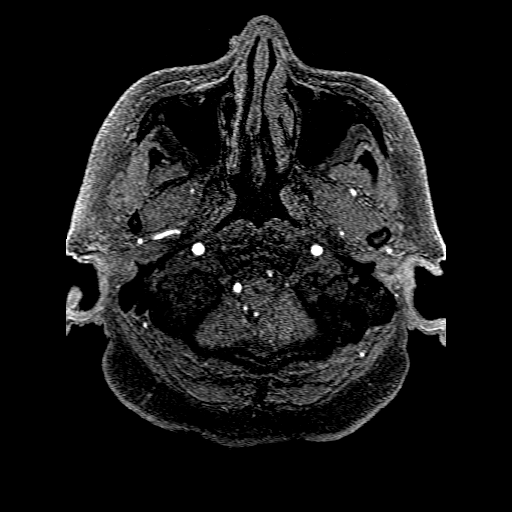
[im 31/176]
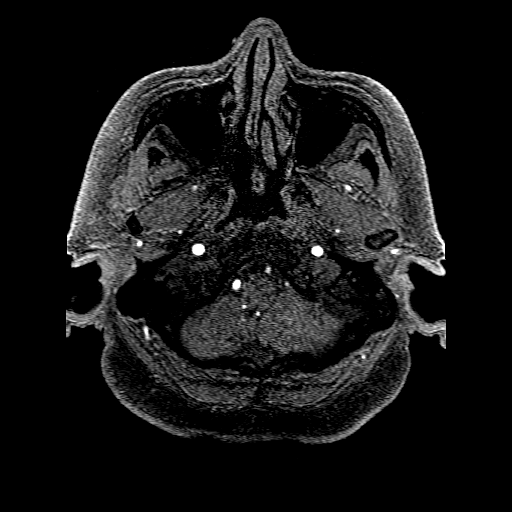
[im 35/176]
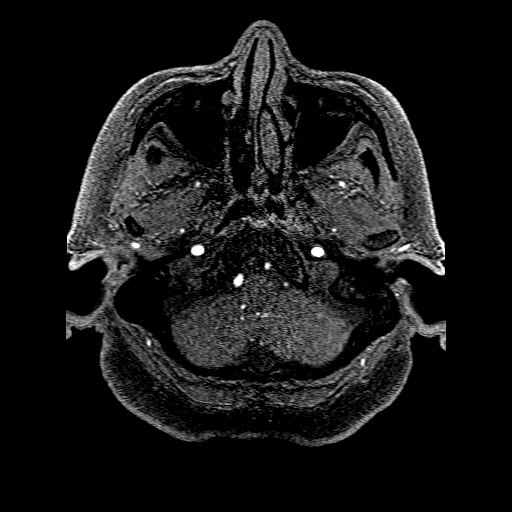
[im 54/176]
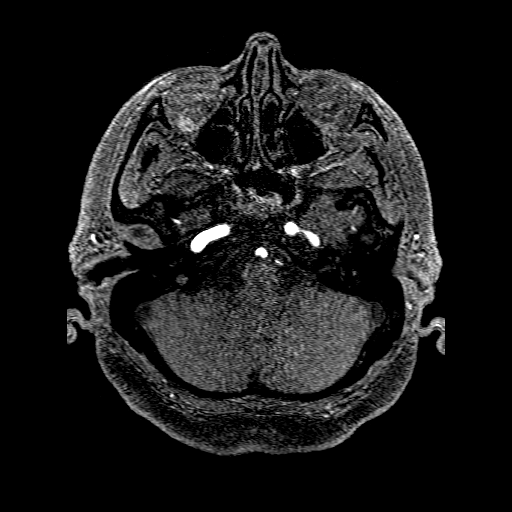
[im 77/176]
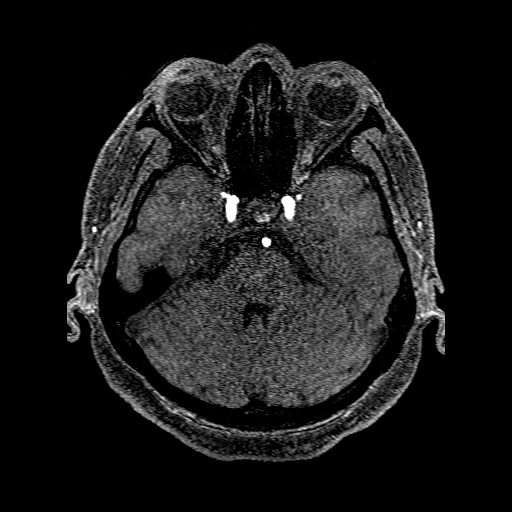
[im 88/176]
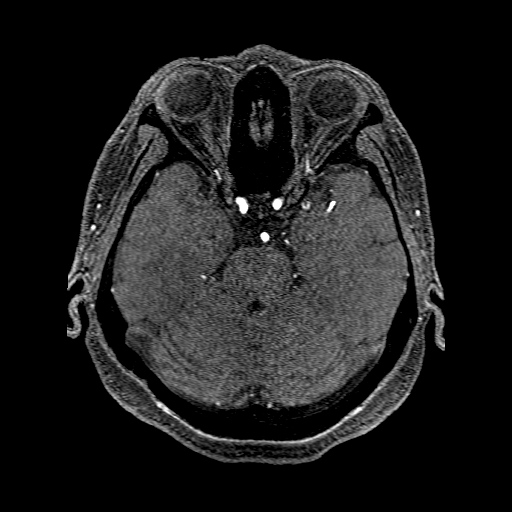
[im 99/176]
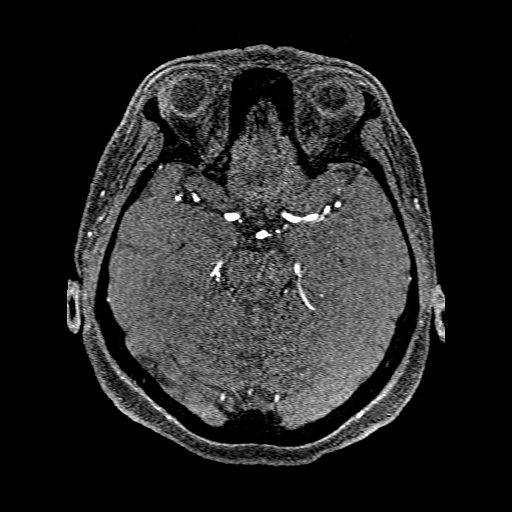
[im 122/176]
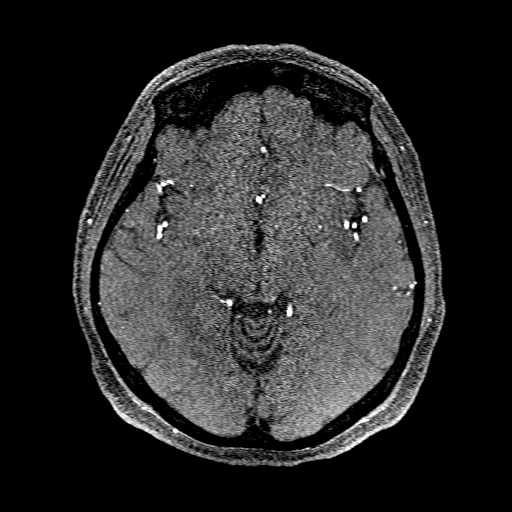
[im 145/176]
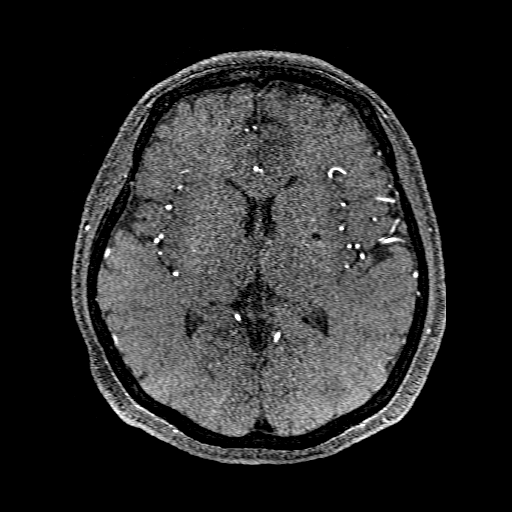
[im 149/176]
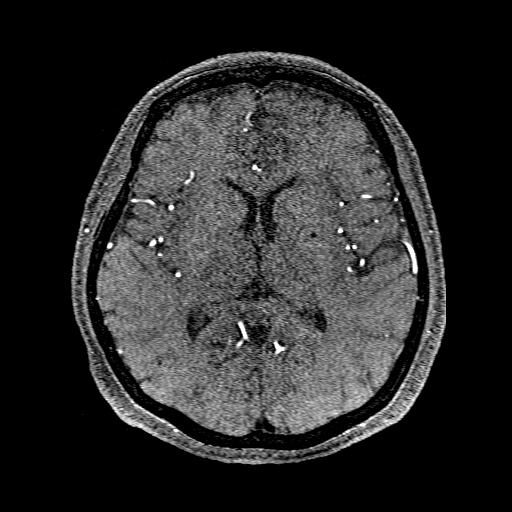
[im 168/176]
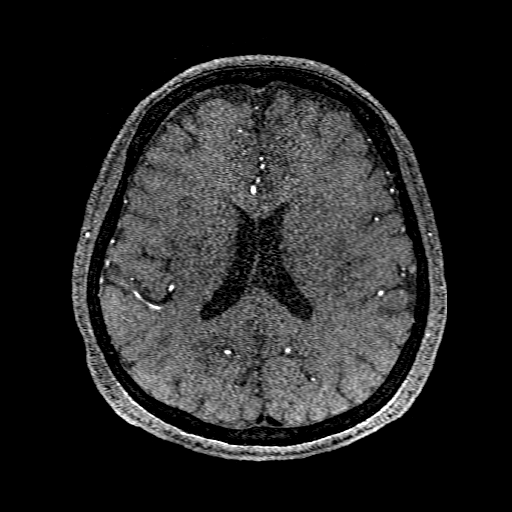

[Series 201: pjn:ax (id) · sagittal · 1.0mm · 0.43mm/px · 1 of 4 slices shown]
[im 1/4]
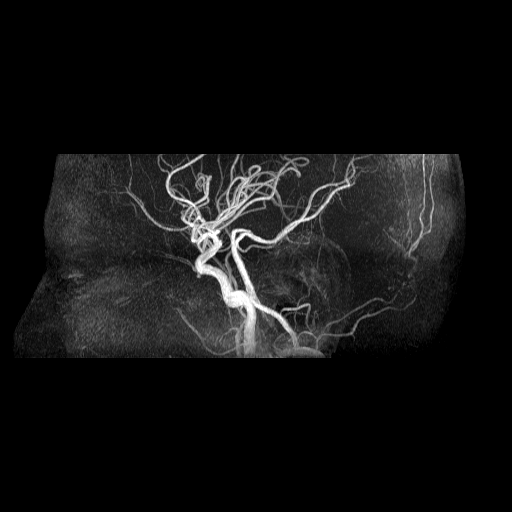

[19 of 48 positions shown; findings below may reference images not displayed]

FINDINGS: Anterior circulation:

The intracranial internal carotid arteries are patent. The M1 middle
cerebral arteries are patent. No M2 proximal branch occlusion or
high-grade proximal stenosis is identified. Non dominant left
anterior cerebral artery. Apparent flow gap within the proximal A1
segment of the left anterior cerebral artery, which may reflect a
severe near-occlusive stenosis or an occlusion with distal
reconstitution via the anterior communicating artery. The anterior
cerebral arteries are imaged to the proximal A5 segment level. The
anterior cerebral arteries are otherwise patent to this level.

Posterior circulation:

The intracranial vertebral arteries are patent. Severe stenosis of
the proximal V4 left vertebral artery versus artifact. The dominant
right vertebral artery is patent intracranially without stenosis.
The basilar artery is patent. The posterior cerebral arteries are
patent. A left posterior communicating artery is present. The right
posterior communicating artery is hypoplastic or absent.

Anatomic variants: As described.

Impression #1 will be called to the ordering clinician or
representative by the Radiologist Assistant, and communication
documented in the PACS or [REDACTED].
IMPRESSION: Apparent flow gap within the proximal A1 segment of the left
anterior cerebral artery, which may reflect a severe near-occlusive
stenosis or an occlusion with distal reconstitution via the anterior
communicating artery.

Severe stenosis of the proximal V4 segment of the left vertebral
artery, versus artifact.

## 2020-12-13 IMAGING — MR MR HEAD W/O CM
6 of 10 series · 27 of 48 positions shown · non-contrast
Comparison: Head CT [DATE].

CLINICAL DATA: Neuro deficit, acute, stroke suspected.

EXAM:
MRI HEAD WITHOUT CONTRAST
TECHNIQUE: Multiplanar, multiecho pulse sequences of the brain and surrounding
structures were obtained without intravenous contrast.

[Series 3: DWI · axial · 3.0mm · 0.94mm/px · z∈[-126,+24]mm · 8 of 104 slices shown (1 of 2)]
[im 1/104]
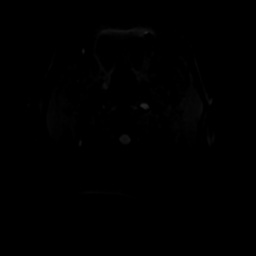
[im 12/104]
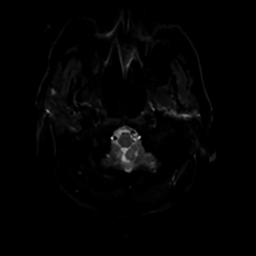
[im 35/104]
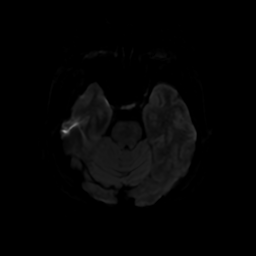
[im 46/104]
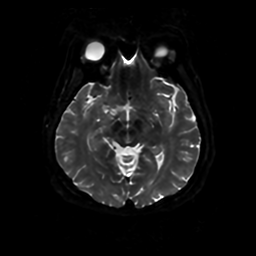
[im 58/104]
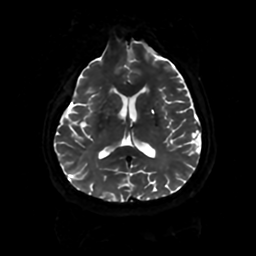
[im 69/104]
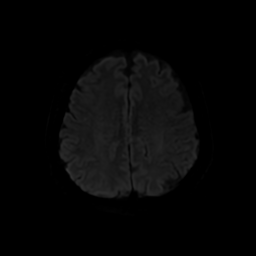
[im 92/104]
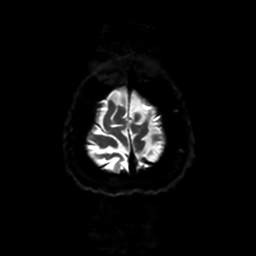
[im 104/104]
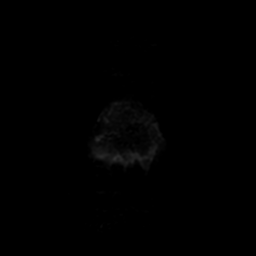

[Series 4: DWI · coronal · 4.0mm · 0.94mm/px · 6 of 68 slices shown (2 of 2)]
[im 1/68]
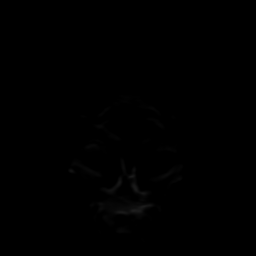
[im 14/68]
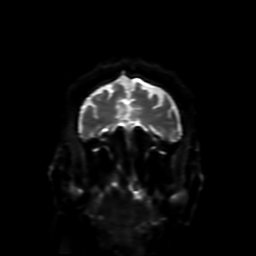
[im 27/68]
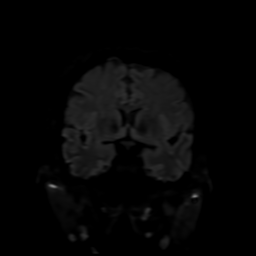
[im 41/68]
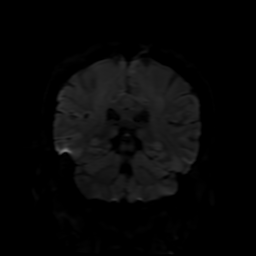
[im 54/68]
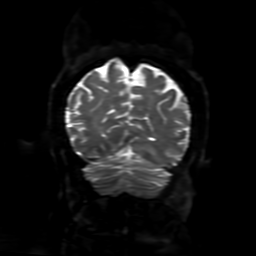
[im 68/68]
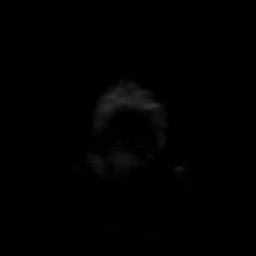

[Series 5: FLAIR · sagittal · 5.0mm · 0.23mm/px · 2 of 25 slices shown (1 of 2)]
[im 1/25]
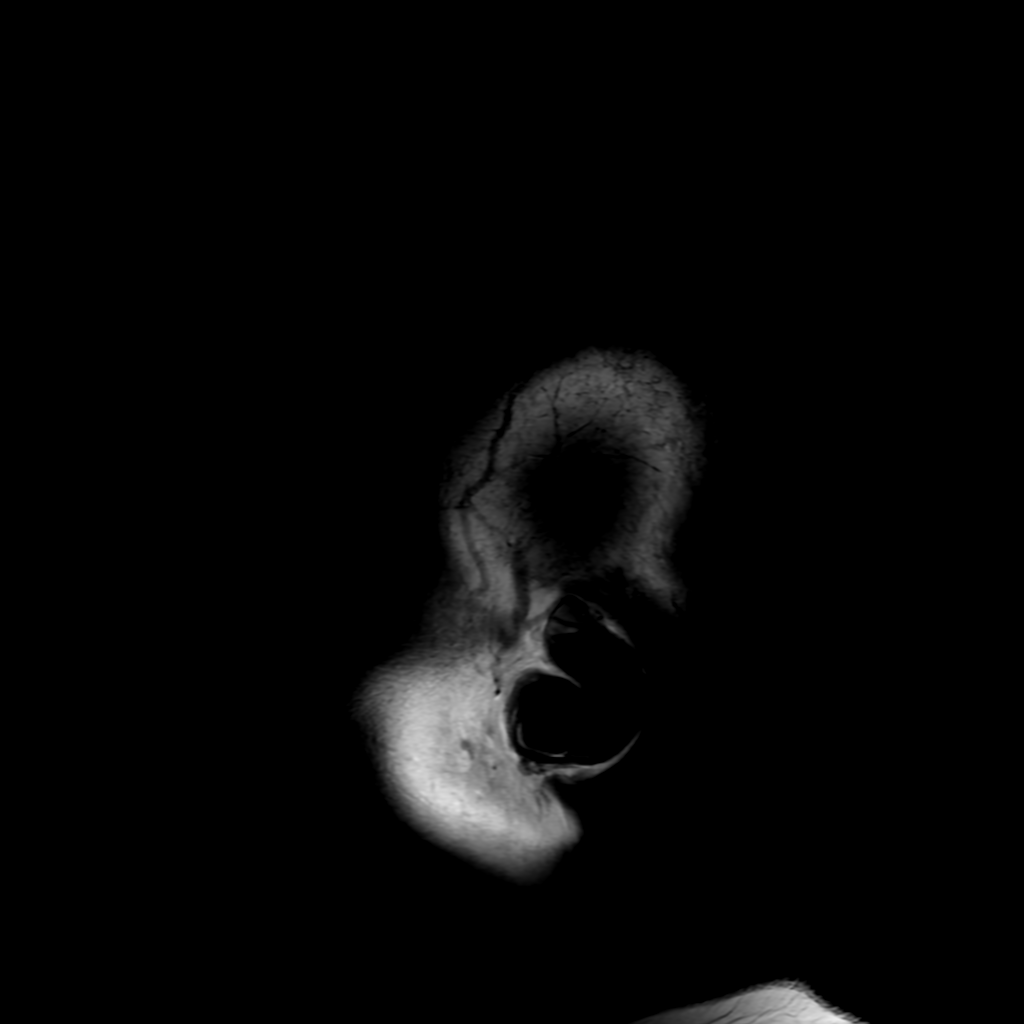
[im 25/25]
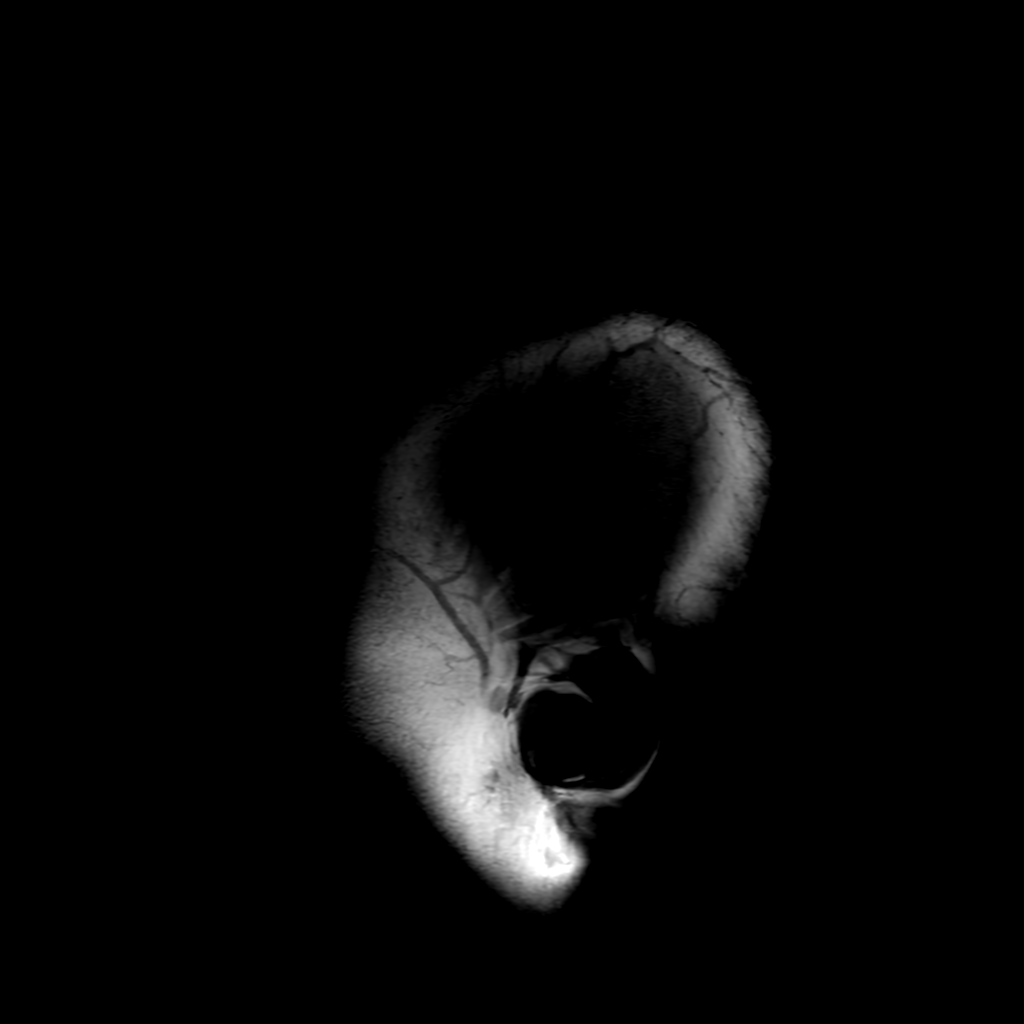

[Series 7: FLAIR · axial · 4.0mm · 0.45mm/px · z∈[-123,+23]mm · 3 of 35 slices shown (2 of 2)]
[im 1/35]
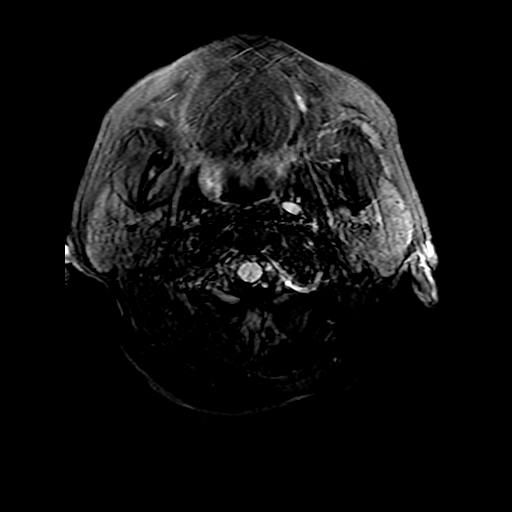
[im 18/35]
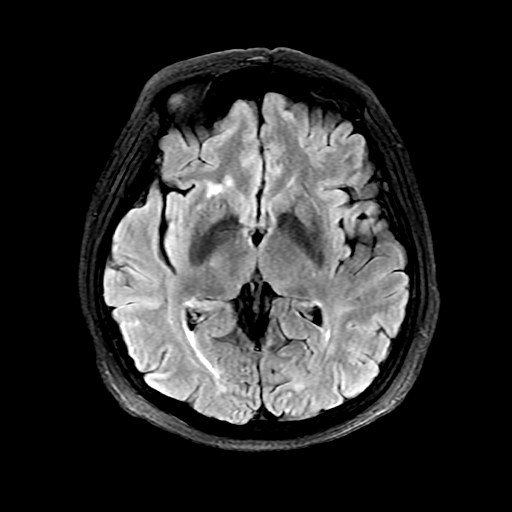
[im 35/35]
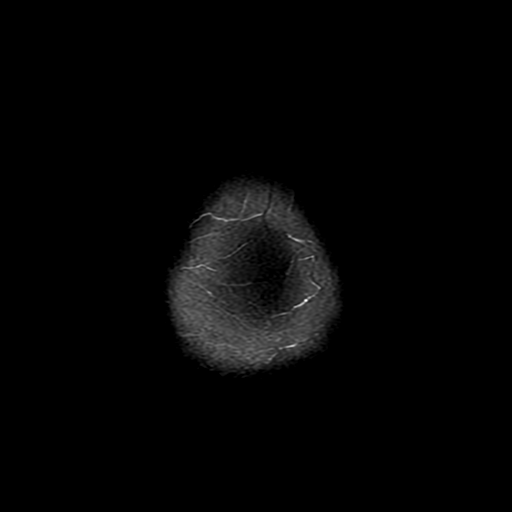

[Series 350: ADC · axial · 3.0mm · 0.94mm/px · z∈[-126,+24]mm · 5 of 52 slices shown (1 of 2)]
[im 1/52]
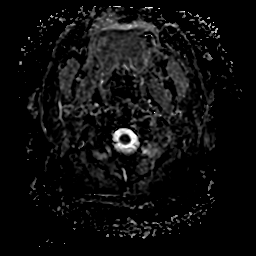
[im 13/52]
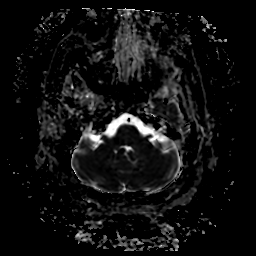
[im 26/52]
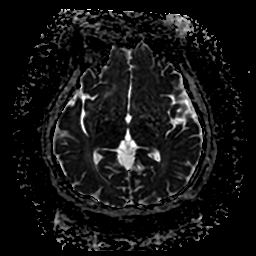
[im 39/52]
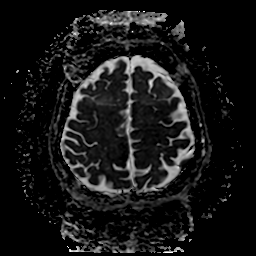
[im 52/52]
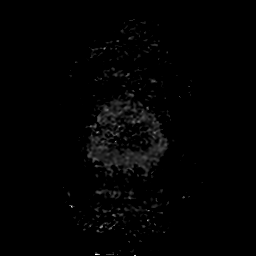

[Series 450: ADC · coronal · 4.0mm · 0.94mm/px · 3 of 33 slices shown (2 of 2)]
[im 1/33]
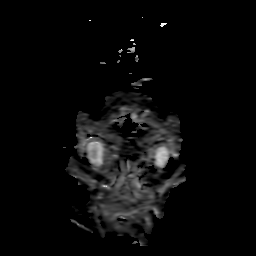
[im 17/33]
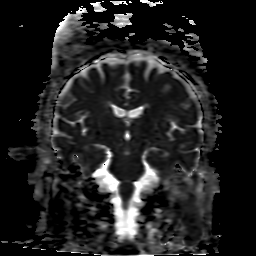
[im 33/33]
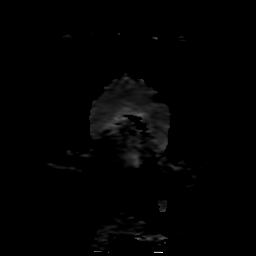

[27 of 48 positions shown; findings below may reference images not displayed]

FINDINGS: Brain:

Cerebral volume is normal.

There are patchy small acute cortical/subcortical infarcts within
the medial posterior left frontal lobe and medial left parietal lobe
(left ACA vascular territory).

Chronic small-vessel infarcts within the right corona radiata and
left basal ganglia.

Background mild multifocal T2/FLAIR hyperintensity within the
cerebral white matter, nonspecific but compatible with chronic small
vessel ischemic disease.

No evidence of an intracranial mass.

No chronic intracranial blood products.

No extra-axial fluid collection.

No midline shift.

Vascular: Maintained flow voids within the proximal large arterial
vessels.

Skull and upper cervical spine: No focal suspicious marrow lesion.

Sinuses/Orbits: Visualized orbits show no acute finding. Trace
mucosal thickening within the bilateral ethmoid and right maxillary
sinuses.
IMPRESSION: Patchy small acute cortical/subcortical infarcts within the medial
posterior left frontal lobe and medial left parietal lobe (left ACA
vascular territory).

Chronic small-vessel infarcts within the right corona radiata and
left basal ganglia.

Background mild chronic small-vessel ischemic changes within the
cerebral white matter.

## 2020-12-13 IMAGING — MR MR CERVICAL SPINE W/O CM
4 of 5 series · 19 of 48 positions shown · non-contrast
Comparison: None.

CLINICAL DATA: Cervical radiculopathy

EXAM:
MRI CERVICAL SPINE WITHOUT CONTRAST
TECHNIQUE: Multiplanar, multisequence MR imaging of the cervical spine was
performed. No intravenous contrast was administered.

[Series 12: T2 · sagittal · 3.0mm · 0.43mm/px · 4 of 15 slices shown (1 of 2)]
[im 1/15]
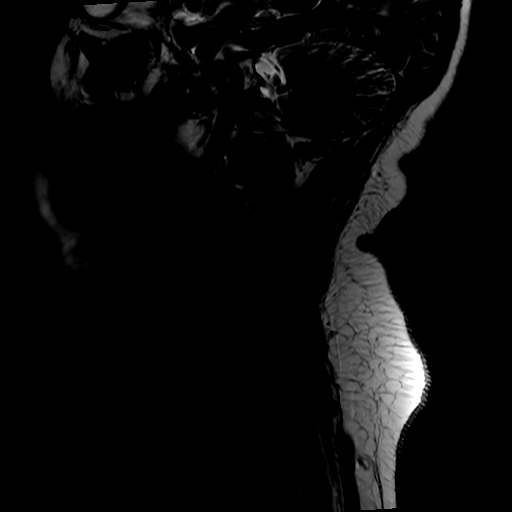
[im 5/15]
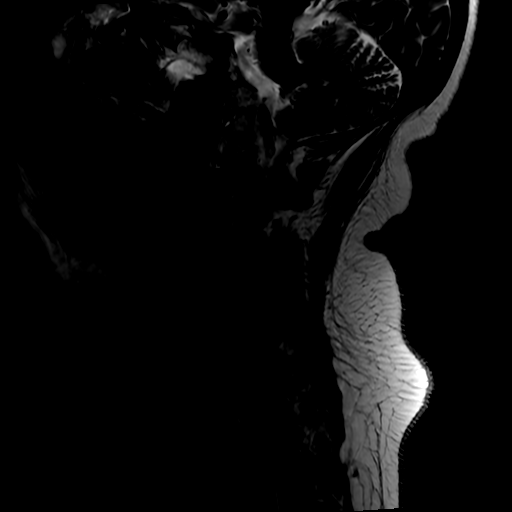
[im 10/15]
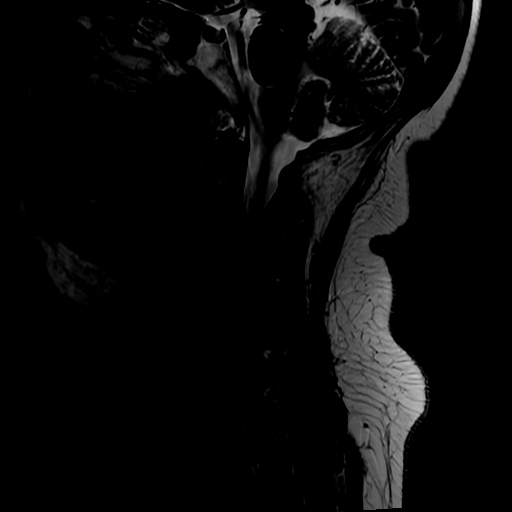
[im 15/15]
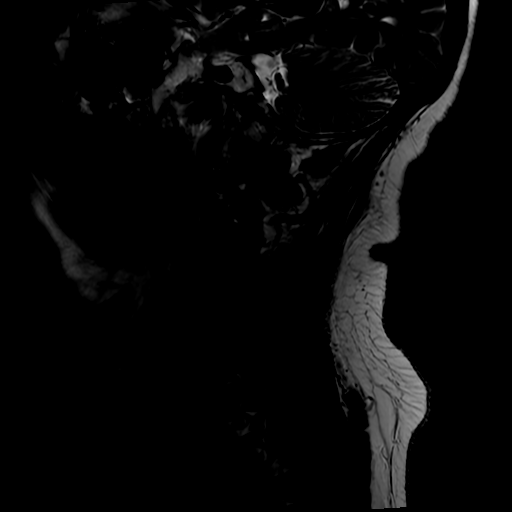

[Series 13: FLAIR · sagittal · 3.0mm · 0.43mm/px · 3 of 15 slices shown]
[im 1/15]
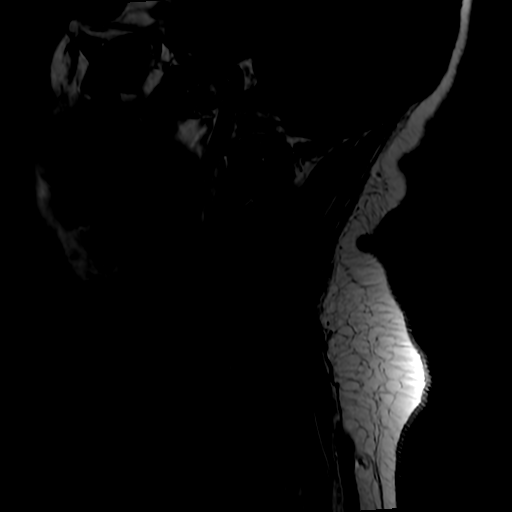
[im 10/15]
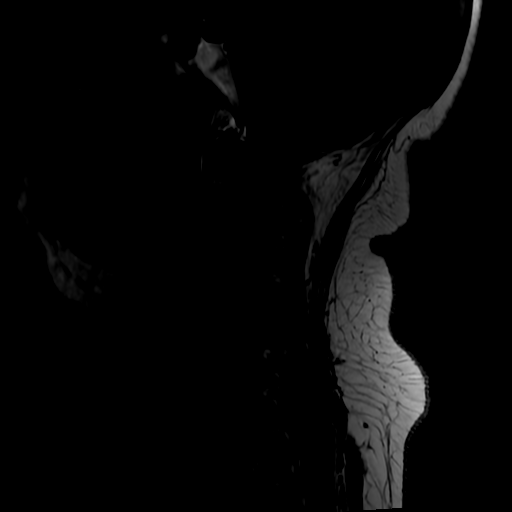
[im 15/15]
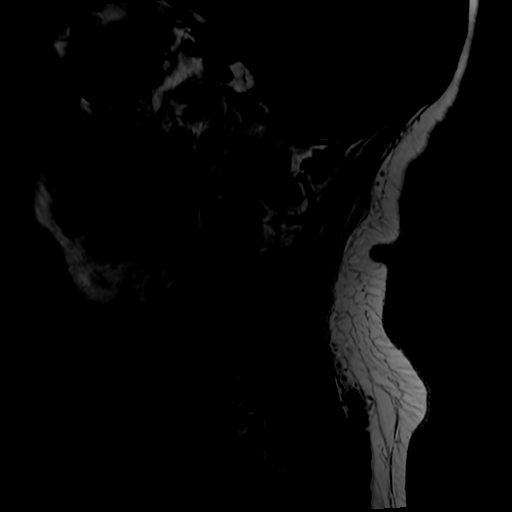

[Series 14: STIR · sagittal · 3.0mm · 0.43mm/px · 3 of 14 slices shown]
[im 1/14]
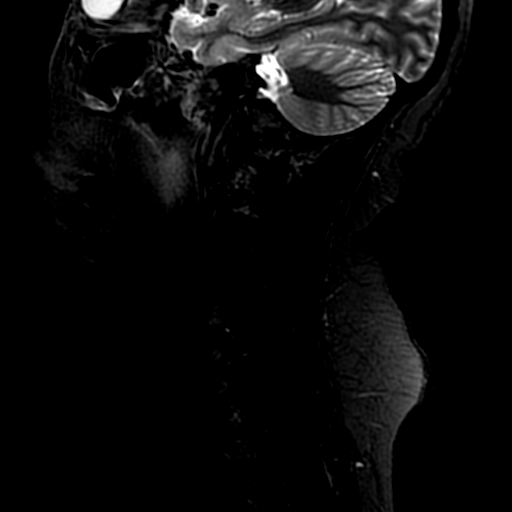
[im 9/14]
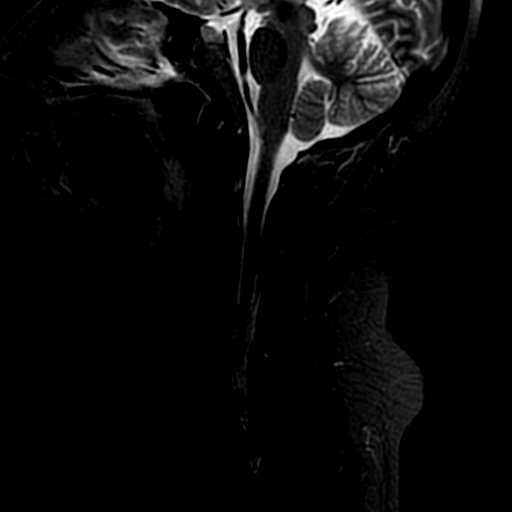
[im 14/14]
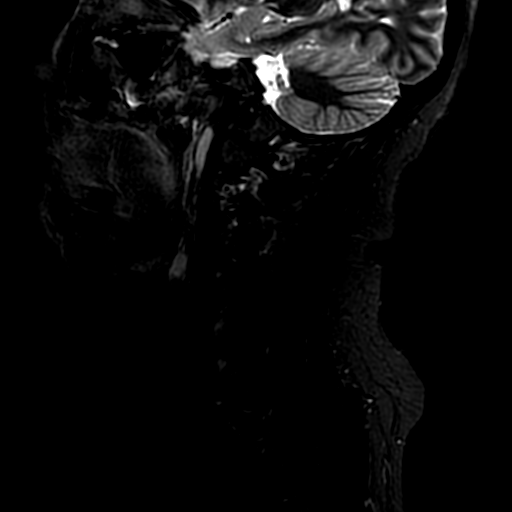

[Series 16: T2 · axial · 3.0mm · 0.35mm/px · z∈[-246,-136]mm · 9 of 35 slices shown (2 of 2)]
[im 1/35]
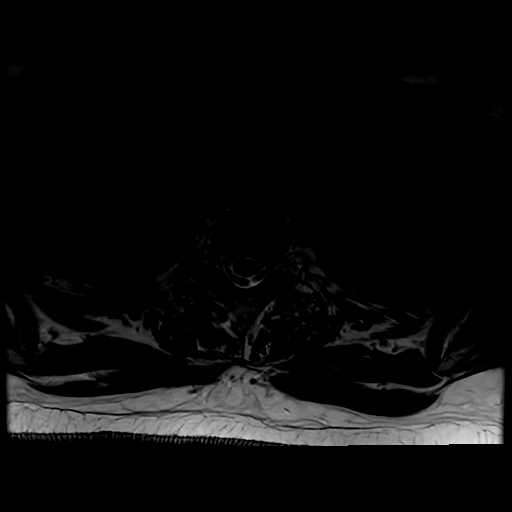
[im 5/35]
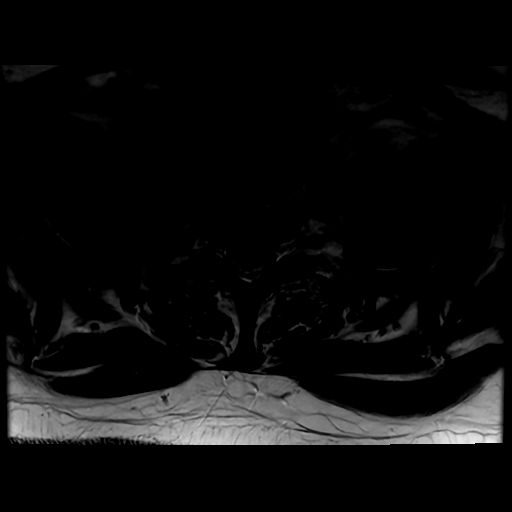
[im 9/35]
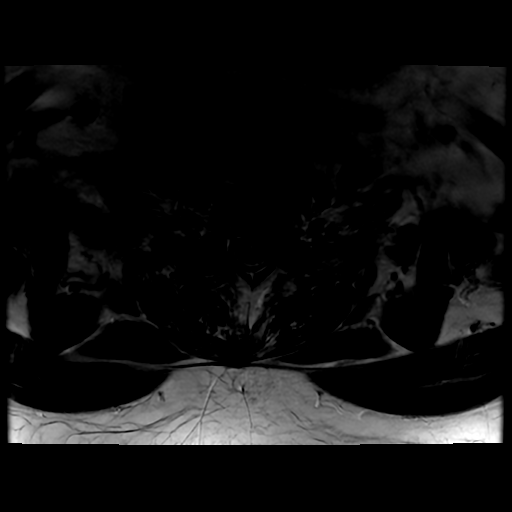
[im 13/35]
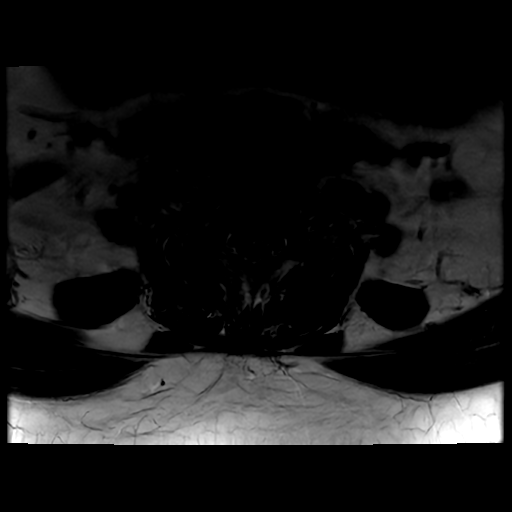
[im 18/35]
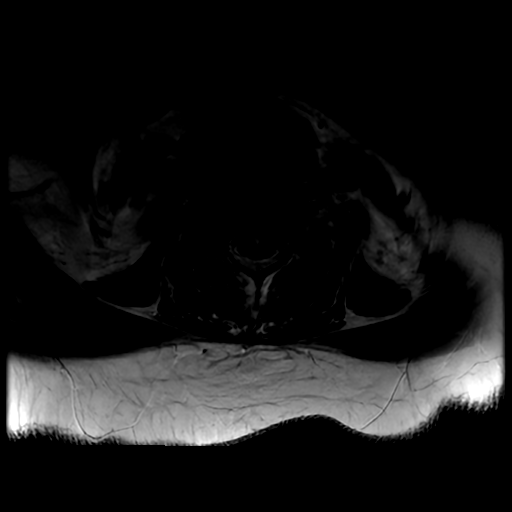
[im 22/35]
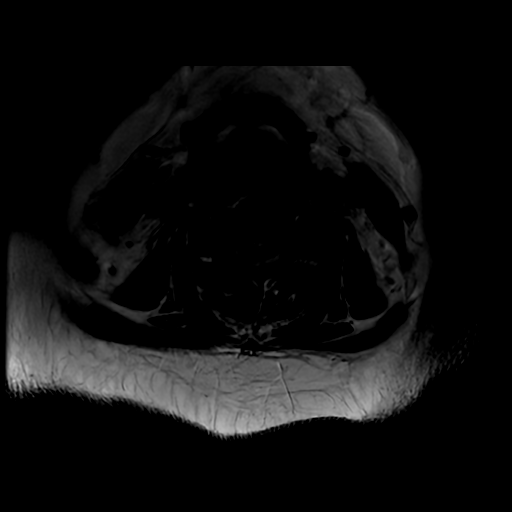
[im 26/35]
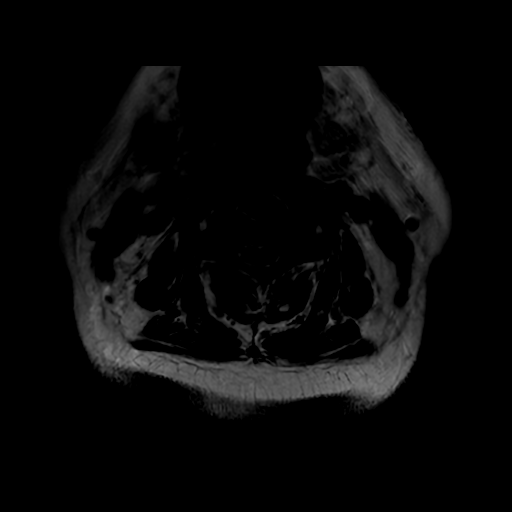
[im 30/35]
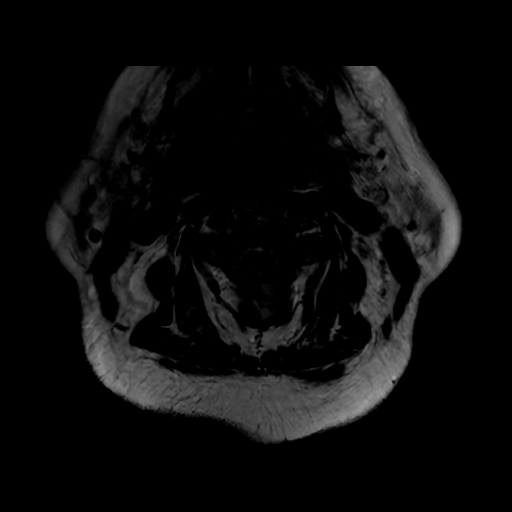
[im 35/35]
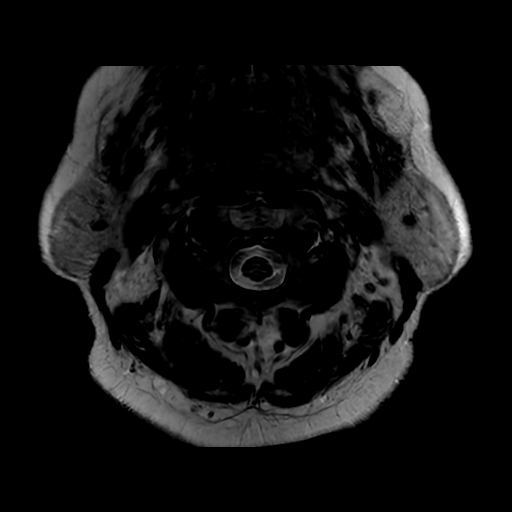

[19 of 48 positions shown; findings below may reference images not displayed]

FINDINGS: Technical Note: Despite efforts by the technologist and patient,
motion artifact is present on today's exam and could not be
eliminated. This reduces exam sensitivity and specificity.

Alignment: Straightening of the cervical lordosis without
significant listhesis.

Vertebrae: No fracture, evidence of discitis, or bone lesion. The C2
and C3 levels are partially fused.

Cord: No definite cord signal abnormality is evident within the
limitations of this motion degraded exam.

Posterior Fossa, vertebral arteries, paraspinal tissues: Please see
dedicated concurrent MRI of the brain for evaluation of the
posterior fossa structures. No significant abnormality is evident
within the paravertebral soft tissues.

Disc levels:

C2-C3: No significant disc protrusion, foraminal stenosis, or canal
stenosis.

C3-C4: Shallow left paracentral disc protrusion. Mild left facet
arthropathy. Borderline-mild canal stenosis. No significant
foraminal stenosis.

C4-C5: No definite disc protrusion. Bilateral facet arthropathy. No
significant foraminal or canal stenosis.

C5-C6: No definite disc protrusion. Mild facet arthropathy. No
foraminal or canal stenosis.

C6-C7: Shallow central disc protrusion. Mild bilateral facet
arthropathy. No foraminal or canal stenosis.

C7-T1: No significant disc protrusion, foraminal stenosis, or canal
stenosis.
IMPRESSION: 1. Motion degraded exam.
2. Mild degenerative changes of the cervical spine with
borderline-mild canal stenosis at C3-C4. No appreciable foraminal
stenosis at any level.

## 2020-12-13 MED ORDER — STROKE: EARLY STAGES OF RECOVERY BOOK
Freq: Once | Status: AC
  Filled 2020-12-13 (×2): qty 1

## 2020-12-13 MED ORDER — GADOBUTROL 1 MMOL/ML IV SOLN
8.5000 mL | Freq: Once | INTRAVENOUS | Status: AC | PRN
  Administered 2020-12-13: 8.5 mL via INTRAVENOUS

## 2020-12-13 MED ORDER — ACETAMINOPHEN 325 MG PO TABS: 650.0000 mg | ORAL_TABLET | ORAL | Status: DC | PRN

## 2020-12-13 MED ORDER — ENOXAPARIN SODIUM 40 MG/0.4ML IJ SOSY
40.0000 mg | PREFILLED_SYRINGE | INTRAMUSCULAR | Status: DC
  Administered 2020-12-13 – 2020-12-16 (×4): 40 mg via SUBCUTANEOUS
  Filled 2020-12-13 (×4): qty 0.4

## 2020-12-13 MED ORDER — ACETAMINOPHEN 160 MG/5ML PO SOLN: 650.0000 mg | ORAL | Status: DC | PRN

## 2020-12-13 MED ORDER — ASPIRIN EC 81 MG PO TBEC
81.0000 mg | DELAYED_RELEASE_TABLET | Freq: Every day | ORAL | Status: DC
  Administered 2020-12-13 – 2020-12-16 (×4): 81 mg via ORAL
  Filled 2020-12-13 (×4): qty 1

## 2020-12-13 MED ORDER — ACETAMINOPHEN 650 MG RE SUPP: 650.0000 mg | RECTAL | Status: DC | PRN

## 2020-12-13 MED ORDER — SODIUM CHLORIDE 0.9 % IV SOLN: INTRAVENOUS | Status: DC

## 2020-12-13 MED ORDER — ASPIRIN 300 MG RE SUPP: 300.0000 mg | Freq: Every day | RECTAL | Status: DC

## 2020-12-13 MED ORDER — ASPIRIN 325 MG PO TABS
325.0000 mg | ORAL_TABLET | Freq: Every day | ORAL | Status: DC
  Administered 2020-12-13: 325 mg via ORAL
  Filled 2020-12-13: qty 1

## 2020-12-13 MED ORDER — ATORVASTATIN CALCIUM 80 MG PO TABS
80.0000 mg | ORAL_TABLET | Freq: Every day | ORAL | Status: DC
  Administered 2020-12-13 – 2020-12-16 (×4): 80 mg via ORAL
  Filled 2020-12-13 (×4): qty 1

## 2020-12-13 MED ORDER — CLOPIDOGREL BISULFATE 75 MG PO TABS
75.0000 mg | ORAL_TABLET | Freq: Every day | ORAL | Status: DC
  Administered 2020-12-13 – 2020-12-16 (×4): 75 mg via ORAL
  Filled 2020-12-13 (×4): qty 1

## 2020-12-13 NOTE — Consult Note (Addendum)
Neurology Consult H&P  Mary Fritz MR# [id number] 12/13/2020   CC: right sided weakness  History is obtained from: patient, son and chart  HPI: Mary Fritz is a 60 y.o. female as reviewed below developed right upper and lower extremity weakness and tingling about 6 days ago.  She saw her primary care doctor who ordered an outpatient CT scan of the brain.  She states about 1.5 weeks ago started to have tingling of right arm and leg which lasted up to 5 minutes with complete resolution. During these episodes she feels as if she is unsteady and her right leg is numb and heavy.    The following was taken from Dr. Katherene Ponto note 12/13/2020  4:44 AM:  "...3 months ago started experiencing tingling and numbness of the right upper and lower extremity intermittently over the last 1 week.  Patient symptoms happened a month ago which lasted for few minutes and stopped.  Then the symptoms started again last week happen 4 or 5 times over the period of 5 to 6 days.  The symptoms are largely tingling and numbness of the right upper and lower extremity lasting for 5 minutes during which patient also feels weak and unable to ambulate.  Denies any associated visual symptoms or difficulty speaking or swallowing.  Symptoms resolved completely without any residual symptoms.  Patient had gone to her primary care physician who ordered a CT head which showed possibility of nonacute stroke in the right corona radiata and was referred to the ER."  Denies headache, changes in vision/hearing, sob/cp, f/c/n/v  LKW: unclear tNK given: No osw IR Thrombectomy No Modified Rankin Scale: 0-Completely asymptomatic and back to baseline post- stroke NIHSS: 1 - right sided sensory mild decrease.    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  Cancer Maternal Uncle    PROSTATE OR STOMACH CANCER   Family History Relation Name Status Comments  Maternal Aunt    Deceased    Maternal Uncle         Paternal Grandfather    Deceased     Social History:  reports that she has never smoked. She has never used smokeless tobacco. She reports that she does not drink alcohol and does not use drugs.   Prior to Admission medications   Medication Sig Start Date End Date Taking? Authorizing Provider  clopidogrel (PLAVIX) 75 MG tablet Take 75 mg by mouth daily.   Yes [provider]    Exam: Current vital signs: BP (!) 156/65 (BP Location: Right Arm)   Pulse 63   Temp 98.5 F (36.9 C) (Oral)   Resp 20   Wt 88.6 kg   SpO2 100%   Physical Exam  Constitutional: Appears well-developed and well-nourished.  Psych: Affect appropriate to situation Eyes: No scleral injection HENT: No OP obstruction. Head: Normocephalic.  Cardiovascular: Normal rate and regular rhythm.  Respiratory: Effort normal, symmetric excursions bilaterally, no audible wheezing. GI: Soft.  No distension. There is no tenderness.  Skin: WDI  Neuro: Mental Status: Patient is awake, alert, oriented to person, place, month, year, and situation. Patient is able to give a clear and coherent  history. Speech fluent, intact comprehension and repetition. No signs of aphasia or neglect. Visual Fields are full. Pupils are equal, round, and reactive to light. EOMI without ptosis or diploplia.  Facial sensation is symmetric to temperature Facial movement is symmetric.  Hearing is intact to voice. Uvula midline and palate elevates symmetrically. Shoulder shrug is symmetric. Tongue is midline without atrophy or fasciculations.  Tone is normal. Bulk is normal. 5/5 strength was present in all four extremities. Sensation is very mildly decreased to cold temperature on right and there is numbness right mid-shin to foot.  Deep Tendon Reflexes: 2+ and symmetric in the biceps and patellae. Toes are downgoing bilaterally. FNF and HKS are intact bilaterally. Gait - Deferred  I have reviewed labs in epic and the pertinent results are: Na 138, no other pertinent labs   I have reviewed the images obtained: NCT head showed small, likely nonacute lacunar infarction of the right corona radiata.  Assessment: Mary Fritz is a 60 y.o. female PMHx  as above with transient numbness and weakness in the right side which has resolved except for mild decreased sensation to cold on right and subjective numbness on the right mid-shin to toes and will pursue further MRI of brain and c-spine.   Chart review showed lacunar stroke was noted at [facility] 2018 and may have been worked up as she was previously on statin.  Impression:  Chronic lacunar stroke right corona radiata - Not likely the causing her symptoms. HTN  Plan: - MRI brain without contrast. Late addendum: MRI is positive for stroke and she will need further stroke evaluation: - Recommend TTE. - Recommend labs: HbA1c, lipid panel, TSH. - Recommend Statin if LDL > 70 - Continue aspirin 81mg  daily. - Continue clopidogrel 75mg  daily for 3 weeks. - BP goal <140/90 for now. - Telemetry monitoring for arrhythmia. - Recommend bedside  Swallow screen. - Recommend Stroke education. - Recommend PT/OT/SLP consult.  Electronically signed by:  , MD Page: 12/13/2020, 4:07 AM

## 2020-12-13 NOTE — Progress Notes (Signed)
NURSING PROGRESS NOTE  [name] [id number] Admission Data: 12/13/2020 5:58 AM Attending Provider: Eduard Clos, MD ERX:[id number], Stephanie Coup, MD Code Status: Full   Mary Fritz is a 60 y.o. female patient admitted from ED:  -No acute distress noted.  -No complaints of shortness of breath.  -No complaints of chest pain.   Cardiac Monitoring: Box # [id number] in place. Cardiac monitor yields:normal sinus rhythm.  Blood pressure 133/70, pulse 64, temperature 98.1 F (36.7 C), temperature source Oral, resp. rate 20, height 5' (1.524 m), weight 85.1 kg, SpO2 98 %.   IV Fluids:  IV in place, occlusive dsg intact without redness, IV cath forearm left, condition patent and no redness normal saline at 75/ml.   Allergies:  Patient has no known allergies.  Past Medical History:   has a past medical history of CVA (cerebral vascular accident) (HCC) (2017) and Hypertension.  Past Surgical History:   has a past surgical history that includes Cholecystectomy and Exploratory laparotomy.  Social History:   reports that she has never smoked. She has never used smokeless tobacco. She reports that she does not drink alcohol and does not use drugs.  Skin: Skin intact  Patient/Family orientated to room. Patient is spanish speaking only. Son speaks English better than he understands. Interpretor was used to assist with admission. Admission inpatient armband information verified with patient/family to include name and date of birth and placed on patient arm. Side rails up x 2, fall assessment and education completed with patient/family. Patient/family able to verbalize understanding of risk associated with falls and verbalized understanding to call for assistance before getting out of bed. Call light within reach. Patient/family able to voice and demonstrate understanding of unit orientation instructions.    Will continue to evaluate and treat per MD orders.

## 2020-12-13 NOTE — Evaluation (Signed)
Occupational Therapy Evaluation Patient Details Name: Mary Fritz MRN: [id number] DOB: October 23, 1960 Today's Date: 12/13/2020   History of Present Illness Pt is a 60 y.o. female admitted 9/23 after presenting to ED with c/o transient numbness and weakness RUE/LE. CT revealed small, likely nonacute lacunar infarction of the right corona radiata. MRI pending. PMH: HTN   Clinical Impression   Mary Fritz was indep in all ADL/IADLs including working and driving prior to the above admission. Upon evaluation pt demonstrated R sided weakness and coordination limitations of R hand. Pt completed functional ambulation without AD given min guard for safety; and is currently requiring min guard for ADLs due to balance and safety. Pt would benefit from continued OT acutely to address RUE weakness, incoordination and safety with ADLs. Recommend HHOT to progress function in all tasks in home setting as well as job specific tasks.      Recommendations for follow up therapy are one component of a multi-disciplinary discharge planning process, led by the attending physician.  Recommendations may be updated based on patient status, additional functional criteria and insurance authorization.   Follow Up Recommendations  Home health OT;Supervision/Assistance - 24 hour    Equipment Recommendations  None recommended by OT       Precautions / Restrictions Precautions Precautions: None Restrictions Weight Bearing Restrictions: No      Mobility Bed Mobility Overal bed mobility: Modified Independent             General bed mobility comments: HOB elevated, +rail, increased time    Transfers Overall transfer level: Needs assistance Equipment used: None Transfers: Sit to/from Stand Sit to Stand: Supervision         General transfer comment: supervision for safety    Balance Overall balance assessment: Mild deficits observed, not formally tested             ADL either performed or assessed  with clinical judgement   ADL Overall ADL's : Needs assistance/impaired Eating/Feeding: Independent;Sitting   Grooming: Min guard;Standing   Upper Body Bathing: Set up;Sitting   Lower Body Bathing: Min guard;Sit to/from stand   Upper Body Dressing : Set up;Sitting   Lower Body Dressing: Min guard;Sit to/from stand   Toilet Transfer: Min guard;Ambulation;Regular Toilet   Toileting- Architect and Hygiene: Supervision/safety;Sit to/from stand       Functional mobility during ADLs: Min guard General ADL Comments: pt ws observed reaching for furniture and external support during ambulation without AD; she states she does this at baseline. min gaurd for all functional tasks for balance, safety and mild R sided weakness.     Vision Baseline Vision/History: 1 Wears glasses Ability to See in Adequate Light: 0 Adequate Vision Assessment?: No apparent visual deficits Additional Comments: pt reports no change in her vision     Perception     Praxis      Pertinent Vitals/Pain Pain Assessment: No/denies pain Faces Pain Scale: No hurt Pain Intervention(s): Limited activity within patient's tolerance;Monitored during session     Hand Dominance Right   Extremity/Trunk Assessment Upper Extremity Assessment Upper Extremity Assessment: RUE deficits/detail RUE Deficits / Details: 4+/5 RUE, mild dysdidochokensia, poor thumb to finger coorination, hand writing WFL but slow and deliberate RUE Sensation: WNL RUE Coordination: decreased fine motor   Lower Extremity Assessment Lower Extremity Assessment: Defer to PT evaluation RLE Deficits / Details: pt reports mild weakness of RLE   Cervical / Trunk Assessment Cervical / Trunk Assessment: Normal   Communication Communication Communication: Prefers language other than [place]  Cognition Arousal/Alertness: Awake/alert Behavior During Therapy: WFL for tasks assessed/performed Overall Cognitive Status: Difficult to  assess                                 General Comments: Appropriate. Following commands. Good awareness.   General Comments  VSS on RA, 2 family memebrs present this session    Exercises     Shoulder Instructions      Home Living Family/patient expects to be discharged to:: Private residence Living Arrangements: Children Available Help at Discharge: Family Type of Home: House Home Access: Stairs to enter Secretary/administrator of Steps: 2   Home Layout: One level     Bathroom Shower/Tub: Chief Strategy Officer: Standard     Home Equipment: None          Prior Functioning/Environment Level of Independence: Independent        Comments: pt drives and works        OT Problem List:        OT Treatment/Interventions: Environmental manager;Therapeutic exercise;DME and/or AE instruction;Therapeutic activities;Balance training    OT Goals(Current goals can be found in the care plan section) Acute Rehab OT Goals Patient Stated Goal: home OT Goal Formulation: With patient Time For Goal Achievement: 12/13/20 Potential to Achieve Goals: Fair ADL Goals Pt Will Perform Grooming: with modified independence;standing Pt Will Perform Lower Body Bathing: with modified independence;sit to/from stand Pt Will Perform Lower Body Dressing: with modified independence;sit to/from stand Pt/caregiver will Perform Home Exercise Program: Increased strength;Right Upper extremity;With written HEP provided  OT Frequency: Min 2X/week    AM-PAC OT "6 Clicks" Daily Activity     Outcome Measure Help from another person eating meals?: None Help from another person taking care of personal grooming?: A Little Help from another person toileting, which includes using toliet, bedpan, or urinal?: A Little Help from another person bathing (including washing, rinsing, drying)?: A Little Help from another person to put on and taking off regular upper body clothing?:  None Help from another person to put on and taking off regular lower body clothing?: A Little 6 Click Score: 20   End of Session Nurse Communication: Mobility status  Activity Tolerance: Patient tolerated treatment well Patient left: in bed;with call bell/phone within reach  OT Visit Diagnosis: Unsteadiness on feet (R26.81);Repeated falls (R29.6);Other abnormalities of gait and mobility (R26.89);Muscle weakness (generalized) (M62.81);Other (comment);Hemiplegia and hemiparesis Hemiplegia - Right/Left: Right Hemiplegia - dominant/non-dominant: Dominant Hemiplegia - caused by: Cerebral infarction                Time: 9937-1696 OT Time Calculation (min): 21 min Charges:  OT General Charges $OT Visit: 1 Visit OT Evaluation $OT Eval Moderate Complexity: 1 Mod   Brandan Robicheaux A Anhar Mcdermott 12/13/2020, 12:48 PM

## 2020-12-13 NOTE — Plan of Care (Signed)
  Problem: Education: Goal: Knowledge of disease or condition will improve Outcome: Progressing Goal: Knowledge of secondary prevention will improve Outcome: Progressing Goal: Knowledge of patient specific risk factors addressed and post discharge goals established will improve Outcome: Progressing   Problem: Intracerebral Hemorrhage Tissue Perfusion: Goal: Complications of Intracerebral Hemorrhage will be minimized Outcome: Progressing   Problem: Ischemic Stroke/TIA Tissue Perfusion: Goal: Complications of ischemic stroke/TIA will be minimized Outcome: Progressing   

## 2020-12-13 NOTE — H&P (Signed)
History and Physical    Mary Fritz TAV:[id number] DOB: March 12, 1961 DOA: 12/12/2020  PCP: Bobbye Morton, MD  Patient coming from: Home.  Engineer, structural used.  Chief Complaint: Right upper extremity and lower extremity tingling and numbness.  HPI: Mary Fritz is a 60 y.o. female with prior history of hypertension presently not on any medications after her primary care physician stopped medications 3 months ago started experiencing tingling and numbness of the right upper and lower extremity intermittently over the last 1 week.  Patient symptoms happened a month ago which lasted for few minutes and stopped.  Then the symptoms started again last week happen 4 or 5 times over the period of 5 to 6 days.  The symptoms are largely tingling and numbness of the right upper and lower extremity lasting for 5 minutes during which patient also feels weak and unable to ambulate.  Denies any associated visual symptoms or difficulty speaking or swallowing.  Symptoms resolved completely without any residual symptoms.  Patient had gone to her primary care physician who ordered a CT head which showed possibility of nonacute stroke in the right corona radiata and was referred to the ER.  ED Course: In the ER patient appears nonfocal blood work was largely unremarkable except for WBC of 10.6 EKG shows normal sinus rhythm.  COVID test was negative.  Review of Systems: As per HPI, rest all negative.   Past Medical History:  Diagnosis Date   CVA (cerebral vascular accident) (HCC) 2017   Hypertension     Past Surgical History:  Procedure Laterality Date   CHOLECYSTECTOMY     EXPLORATORY LAPAROTOMY       reports that she has never smoked. She has never used smokeless tobacco. She reports that she does not drink alcohol and does not use drugs.  No Known Allergies  History reviewed. No pertinent family history.  Prior to Admission medications   Medication Sig Start Date End Date  Taking? Authorizing Provider  clopidogrel (PLAVIX) 75 MG tablet Take 75 mg by mouth daily.   Yes [provider]    Physical Exam: Constitutional: Moderately built and nourished. Vitals:   12/13/20 0000 12/13/20 0139 12/13/20 0141 12/13/20 0306  BP: 138/67 (!) 149/85  (!) 156/65  Pulse: 70 77  63  Resp: 15 18  20   Temp:    98.5 F (36.9 C)  TempSrc:    Oral  SpO2: 98% 98%  100%  Weight:   88.6 kg    Eyes: Anicteric no pallor. ENMT: No discharge from the ears eyes nose and mouth. Neck: No mass felt.  No neck rigidity. Respiratory: No rhonchi or crepitations. Cardiovascular: S1-S2 heard. Abdomen: Soft nontender bowel sound present. Musculoskeletal: No edema. Skin: No rash. Neurologic: Alert awake oriented to time place and person.  Moving all extremities 5 x 5.  No facial asymmetry tongue is midline pupils are equal and reactive to light. Psychiatric: Appears normal.  Normal affect.   Labs on Admission: I have personally reviewed following labs and imaging studies  CBC: Recent Labs  Lab 12/12/20 1748  WBC 10.6*  NEUTROABS 6.2  HGB 14.4  HCT 43.6  MCV 87.7  PLT 317   Basic Metabolic Panel: Recent Labs  Lab 12/12/20 1748  NA 138  K 3.9  CL 103  CO2 25  GLUCOSE 88  BUN 12  CREATININE 0.67  CALCIUM 9.9   GFR: CrCl cannot be calculated (Unknown ideal weight.). Liver Function Tests: No results for input(s): AST, ALT,  ALKPHOS, BILITOT, PROT, ALBUMIN in the last 168 hours. No results for input(s): LIPASE, AMYLASE in the last 168 hours. No results for input(s): AMMONIA in the last 168 hours. Coagulation Profile: No results for input(s): INR, PROTIME in the last 168 hours. Cardiac Enzymes: No results for input(s): CKTOTAL, CKMB, CKMBINDEX, TROPONINI in the last 168 hours. BNP (last 3 results) No results for input(s): PROBNP in the last 8760 hours. HbA1C: No results for input(s): HGBA1C in the last 72 hours. CBG: No results for input(s): GLUCAP in the  last 168 hours. Lipid Profile: No results for input(s): CHOL, HDL, LDLCALC, TRIG, CHOLHDL, LDLDIRECT in the last 72 hours. Thyroid Function Tests: No results for input(s): TSH, T4TOTAL, FREET4, T3FREE, THYROIDAB in the last 72 hours. Anemia Panel: No results for input(s): VITAMINB12, FOLATE, FERRITIN, TIBC, IRON, RETICCTPCT in the last 72 hours. Urine analysis: No results found for: COLORURINE, APPEARANCEUR, LABSPEC, PHURINE, GLUCOSEU, HGBUR, BILIRUBINUR, KETONESUR, PROTEINUR, UROBILINOGEN, NITRITE, LEUKOCYTESUR Sepsis Labs: @LABRCNTIP (procalcitonin:4,lacticidven:4) ) Recent Results (from the past 240 hour(s))  Resp Panel by RT-PCR (Flu A&B, Covid) Nasopharyngeal Swab     Status: None   Collection Time: 12/12/20  5:48 PM   Specimen: Nasopharyngeal Swab; Nasopharyngeal(NP) swabs in vial transport medium  Result Value Ref Range Status   SARS Coronavirus 2 by RT PCR NEGATIVE NEGATIVE Final    Comment: (NOTE) SARS-CoV-2 target nucleic acids are NOT DETECTED.  The SARS-CoV-2 RNA is generally detectable in upper respiratory specimens during the acute phase of infection. The lowest concentration of SARS-CoV-2 viral copies this assay can detect is 138 copies/mL. A negative result does not preclude SARS-Cov-2 infection and should not be used as the sole basis for treatment or other patient management decisions. A negative result may occur with  improper specimen collection/handling, submission of specimen other than nasopharyngeal swab, presence of viral mutation(s) within the areas targeted by this assay, and inadequate number of viral copies(<138 copies/mL). A negative result must be combined with clinical observations, patient history, and epidemiological information. The expected result is Negative.  Fact Sheet for Patients:  12/14/20  Fact Sheet for Healthcare Providers:  BloggerCourse.com  This test is no t yet approved or  cleared by the [employer] FDA and  has been authorized for detection and/or diagnosis of SARS-CoV-2 by FDA under an Emergency Use Authorization (EUA). This EUA will remain  in effect (meaning this test can be used) for the duration of the COVID-19 declaration under Section 564(b)(1) of the Act, 21 U.S.C.section 360bbb-3(b)(1), unless the authorization is terminated  or revoked sooner.       Influenza A by PCR NEGATIVE NEGATIVE Final   Influenza B by PCR NEGATIVE NEGATIVE Final    Comment: (NOTE) The Xpert Xpress SARS-CoV-2/FLU/RSV plus assay is intended as an aid in the diagnosis of influenza from Nasopharyngeal swab specimens and should not be used as a sole basis for treatment. Nasal washings and aspirates are unacceptable for Xpert Xpress SARS-CoV-2/FLU/RSV testing.  Fact Sheet for Patients: [facility]  Fact Sheet for Healthcare Providers: BloggerCourse.com  This test is not yet approved or cleared by the [employer] FDA and has been authorized for detection and/or diagnosis of SARS-CoV-2 by FDA under an Emergency Use Authorization (EUA). This EUA will remain in effect (meaning this test can be used) for the duration of the COVID-19 declaration under Section 564(b)(1) of the Act, 21 U.S.C. section 360bbb-3(b)(1), unless the authorization is terminated or revoked.  Performed at [facility], [address], [place], [place]  Radiological Exams on Admission: CT HEAD WO CONTRAST ( )  Result Date: 12/12/2020 CLINICAL DATA:  Right arm and leg numbness and heaviness for 1 month, increasingly frequent episodes EXAM: CT HEAD WITHOUT CONTRAST TECHNIQUE: Contiguous axial images were obtained from the base of the skull through the vertex without intravenous contrast. COMPARISON:  None. FINDINGS: Brain: No evidence of acute infarction, hemorrhage, hydrocephalus, extra-axial collection or  mass lesion/mass effect. Small, likely nonacute lacunar infarction of the right corona radiata (series 2, image 18) Vascular: No hyperdense vessel or unexpected calcification. Skull: Normal. Negative for fracture or focal lesion. Sinuses/Orbits: No acute finding. Other: None. IMPRESSION: 1. No acute intracranial pathology. 2. Small, likely nonacute lacunar infarction of the right corona radiata. 3. Consider MRI to more sensitively evaluate for acute diffusion restricting infarction if clinically suspected. These results will be called to the ordering clinician or representative by the Radiologist Assistant, and communication documented in the PACS or [employer]. Electronically Signed   By: Lauralyn Primes M.D.   On: 12/12/2020 14:34    EKG: Independently reviewed.  Normal sinus rhythm.  Assessment/Plan Principal Problem:   Stroke [facility])    Possible CVA -at the time of my exam patient appears nonfocal.  Discussed with Dr. Thomasena Edis on-call neurologist will be seeing patient in consult at this time requested getting MRI brain and if MRI brain does show stroke and further work-up.  Patient did pass stroke swallow.  We will keep patient on neurochecks. History of hypertension presently not on any medications.  We will follow blood pressure trends.   DVT prophylaxis: Lovenox. Code Status: Full code. Family Communication: Discussed with patient. Disposition Plan: Home. Consults called: Neurology. Admission status: Observation.   Eduard Clos MD [facility] Pager [phone].  If 7PM-7AM, please contact night-coverage [email or web address] Password [facility]  12/13/2020, 4:44 AM

## 2020-12-13 NOTE — Progress Notes (Signed)
No charge progress note.   Mary Fritz is a 60 y.o. female with prior history of hypertension presently not on any medications after her primary care physician stopped medications 3 months ago started experiencing tingling and numbness of the right upper and lower extremity intermittently over the last 1 week.  Patient symptoms happened a month ago which lasted for few minutes and stopped.  Then the symptoms started again last week happen 4 or 5 times over the period of 5 to 6 days.  The symptoms are largely tingling and numbness of the right upper and lower extremity lasting for 5 minutes during which patient also feels weak and unable to ambulate.  Denies any associated visual symptoms or difficulty speaking or swallowing.  Symptoms resolved completely without any residual symptoms. Patient had gone to her primary care physician who ordered a CT head which showed possibility of nonacute stroke in the right corona radiata and was referred to the ER.  Initial CT head was negative for any acute stroke but did show remote lacunar strokes.  MRI with patchy small acute cortical/Subcortical infarcts within the medial posterior left frontal lobe and medial left parietal lobe, left ACA territory.  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  Neurology was also consulted-will appreciate their recommendations.  Patient was feeling at baseline when seen during morning rounds.  No residual symptoms.  She wants to go home.  We discussed about completion of stroke work-up and she agrees to stay 1 more day. 2 sons at bedside.  Spanish interpreter was used for communication. Exam was  benign.  -Start her on aspirin and Plavix -Start her on statin -Complete stroke work-up.

## 2020-12-13 NOTE — Evaluation (Signed)
Physical Therapy Evaluation Patient Details Name: Mary Fritz MRN: [id number] DOB: [birth year] Today's Date: 12/13/2020  History of Present Illness  Pt is a 60 y.o. female admitted 9/23 after presenting to ED with c/o transient numbness and weakness RUE/LE. CT revealed small, likely nonacute lacunar infarction of the right corona radiata. MRI pending. PMH: HTN   Clinical Impression  Pt admitted with above diagnosis. PTA pt lived at home with her son, independent mobility. On eval, she required supervision transfers, and min guard assist ambulation 250' without AD. 5/5 strength BLE but pt reports muscular fatigue RLE during last 50-75' of gait. No buckling noted, but slower more guarded cadence observed. Pt will benefit from skilled PT to increase their independence and safety with mobility to allow discharge home. PT to follow acutely. No follow up services indicated.          Recommendations for follow up therapy are one component of a multi-disciplinary discharge planning process, led by the attending physician.  Recommendations may be updated based on patient status, additional functional criteria and insurance authorization.  Follow Up Recommendations No PT follow up    Equipment Recommendations  None recommended by PT    Recommendations for Other Services       Precautions / Restrictions Precautions Precautions: None      Mobility  Bed Mobility Overal bed mobility: Modified Independent             General bed mobility comments: HOB elevated, +rail, increased time    Transfers Overall transfer level: Needs assistance Equipment used: None Transfers: Sit to/from Stand Sit to Stand: Supervision         General transfer comment: supervision for safety  Ambulation/Gait Ambulation/Gait assistance: Min guard Gait Distance (Feet): 250 Feet Assistive device: None Gait Pattern/deviations: Step-through pattern;Decreased stride length Gait velocity:  decreased Gait velocity interpretation: 1.31 - 2.62 ft/sec, indicative of limited community ambulator General Gait Details: No LOB noted. Fatigue noted during last 50-75' of ambulation. Pt reported feeling weakness RLE. No buckling noted.  Stairs            Wheelchair Mobility    Modified Rankin (Stroke Patients Only) Modified Rankin (Stroke Patients Only) Pre-Morbid Rankin Score: No symptoms Modified Rankin: Moderate disability     Balance Overall balance assessment: Mild deficits observed, not formally tested                                           Pertinent Vitals/Pain Pain Assessment: No/denies pain    Home Living Family/patient expects to be discharged to:: Private residence Living Arrangements: Children (son) Available Help at Discharge: Family Type of Home: House Home Access: Stairs to enter   Secretary/administrator of Steps: 2 Home Layout: One level Home Equipment: None      Prior Function Level of Independence: Independent               Hand Dominance        Extremity/Trunk Assessment   Upper Extremity Assessment Upper Extremity Assessment: Defer to OT evaluation    Lower Extremity Assessment Lower Extremity Assessment: RLE deficits/detail RLE Deficits / Details: c/o transient numbness/pain/tingling dorsum of R foot    Cervical / Trunk Assessment Cervical / Trunk Assessment: Normal  Communication   Communication: Prefers language other than English;Interpreter utilized Sales promotion account executive, Huel Cote [phone]. Son also present in room. He speaks English puts prefers use of interpreter  when dealing with medical terms.)  Cognition Arousal/Alertness: Awake/alert Behavior During Therapy: WFL for tasks assessed/performed Overall Cognitive Status: Difficult to assess                                 General Comments: Appropriate. Following commands. Good awareness.      General Comments      Exercises      Assessment/Plan    PT Assessment Patient needs continued PT services  PT Problem List Decreased mobility;Decreased activity tolerance;Decreased balance       PT Treatment Interventions Therapeutic activities;Gait training;Therapeutic exercise;Patient/family education;Balance training;Stair training;Functional mobility training    PT Goals (Current goals can be found in the Care Plan section)  Acute Rehab PT Goals Patient Stated Goal: home PT Goal Formulation: With patient/family Time For Goal Achievement: 12/27/20 Potential to Achieve Goals: Good    Frequency Min 4X/week   Barriers to discharge        Co-evaluation               AM-PAC PT "6 Clicks" Mobility  Outcome Measure Help needed turning from your back to your side while in a flat bed without using bedrails?: None Help needed moving from lying on your back to sitting on the side of a flat bed without using bedrails?: None Help needed moving to and from a bed to a chair (including a wheelchair)?: A Little Help needed standing up from a chair using your arms (e.g., wheelchair or bedside chair)?: A Little Help needed to walk in hospital room?: A Little Help needed climbing 3-5 steps with a railing? : A Little 6 Click Score: 20    End of Session Equipment Utilized During Treatment: Gait belt Activity Tolerance: Patient tolerated treatment well Patient left: in bed;with call bell/phone within reach;with family/visitor present Nurse Communication: Mobility status PT Visit Diagnosis: Difficulty in walking, not elsewhere classified (R26.2)    Time: 9833-8250 PT Time Calculation (min) (ACUTE ONLY): 32 min   Charges:   PT Evaluation $PT Eval Moderate Complexity: 1 Mod PT Treatments $Gait Training: 8-22 mins        Aida Raider, PT  Office # [phone] Pager [phone]   Ilda Foil 12/13/2020, 9:30 AM

## 2020-12-13 NOTE — ED Notes (Signed)
Called [facility] to transport patient to 3W room 28

## 2020-12-14 ENCOUNTER — Observation Stay (HOSPITAL_COMMUNITY): Payer: Commercial Managed Care - PPO

## 2020-12-14 ENCOUNTER — Other Ambulatory Visit (HOSPITAL_COMMUNITY): Payer: Commercial Managed Care - PPO

## 2020-12-14 ENCOUNTER — Observation Stay (HOSPITAL_BASED_OUTPATIENT_CLINIC_OR_DEPARTMENT_OTHER): Payer: Commercial Managed Care - PPO

## 2020-12-14 DIAGNOSIS — I639 Cerebral infarction, unspecified: Secondary | ICD-10-CM

## 2020-12-14 DIAGNOSIS — I6389 Other cerebral infarction: Secondary | ICD-10-CM

## 2020-12-14 LAB — ECHOCARDIOGRAM COMPLETE
Area-P 1/2: 3.12 cm2
Calc EF: 61.7 %
Height: 60 in
S' Lateral: 2 cm
Single Plane A2C EF: 64.4 %
Single Plane A4C EF: 59.2 %
Weight: 3001.78 oz

## 2020-12-14 IMAGING — MR MR CERVICAL SPINE W/ CM
4 series · 23 of 48 positions shown · IV contrast (agent unspecified)
Comparison: MRI of the cervical spine [DATE].

CLINICAL DATA: Demyelinating disease.

EXAM:
MRI CERVICAL SPINE WITH CONTRAST
TECHNIQUE: Multiplanar, multisequence MR imaging of the cervical spine was
performed following the administration of intravenous contrast.

[Series 1: T2 · sagittal · 3.0mm · 0.69mm/px · 7 of 15 slices shown]
[im 1/15]
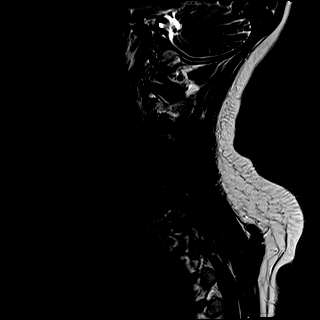
[im 3/15]
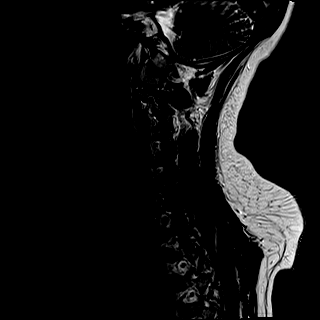
[im 5/15]
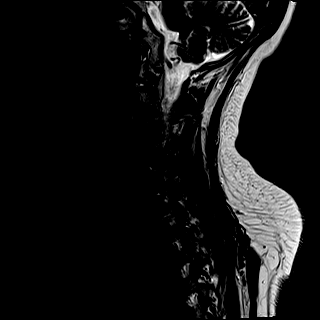
[im 8/15]
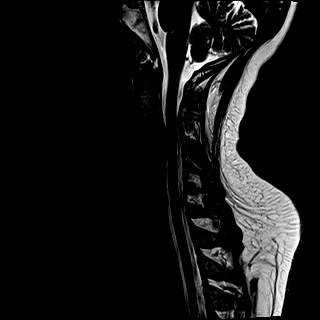
[im 10/15]
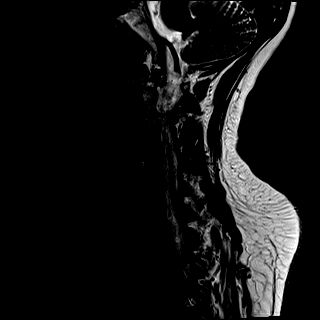
[im 12/15]
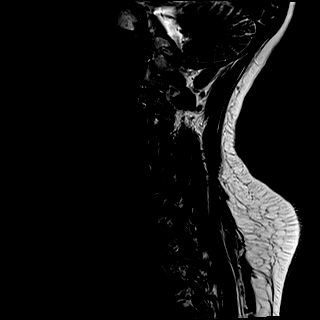
[im 15/15]
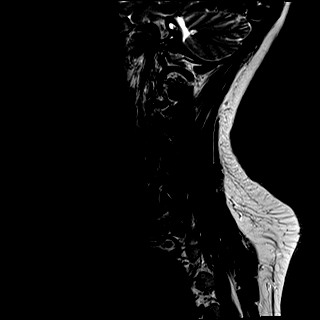

[Series 2: T1 · axial · 3.0mm · 0.39mm/px · z∈[-310,-213]mm · 10 of 40 slices shown]
[im 3/40]
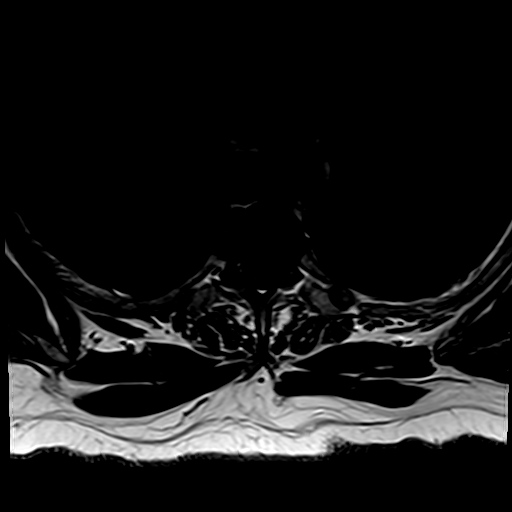
[im 5/40]
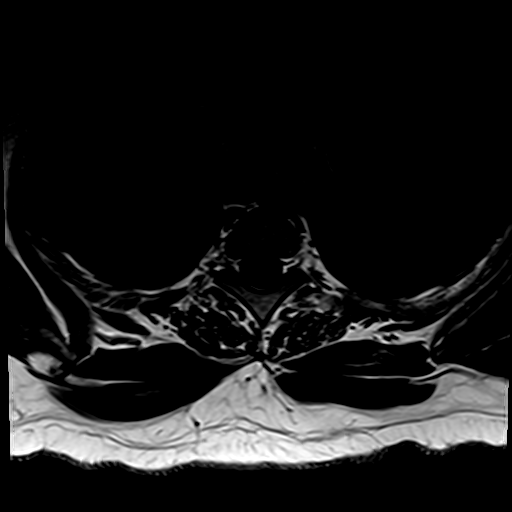
[im 8/40]
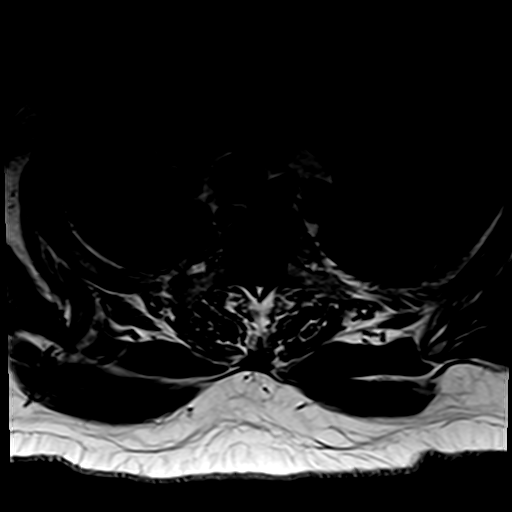
[im 13/40]
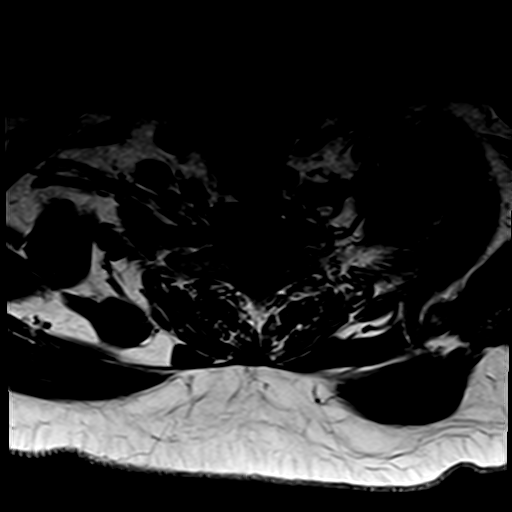
[im 18/40]
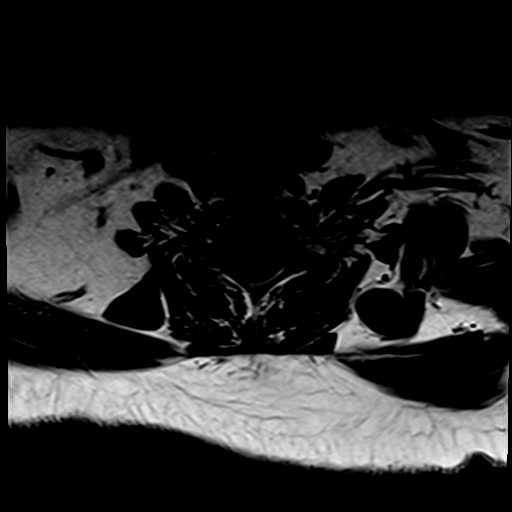
[im 20/40]
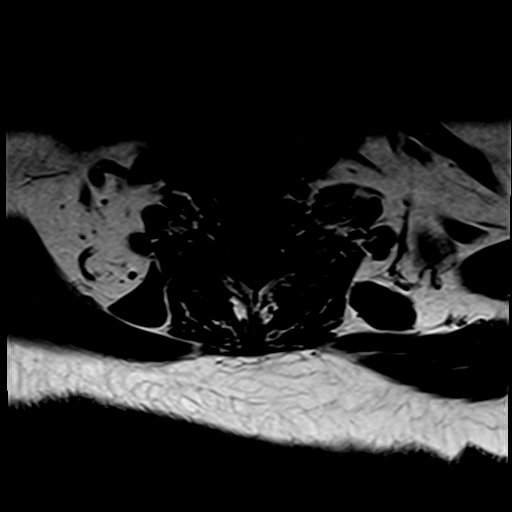
[im 22/40]
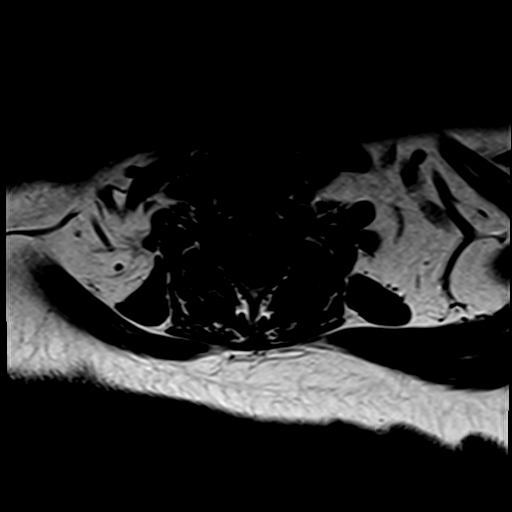
[im 27/40]
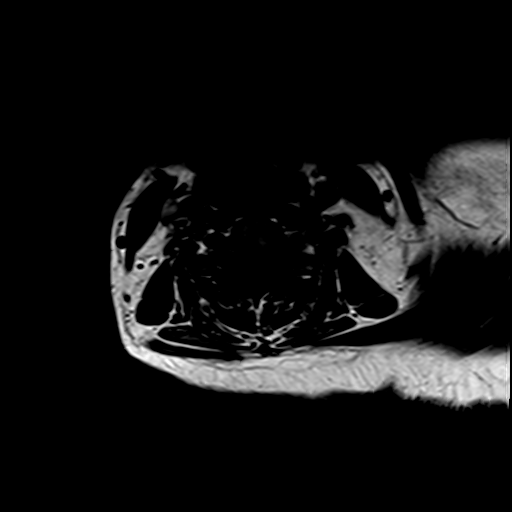
[im 32/40]
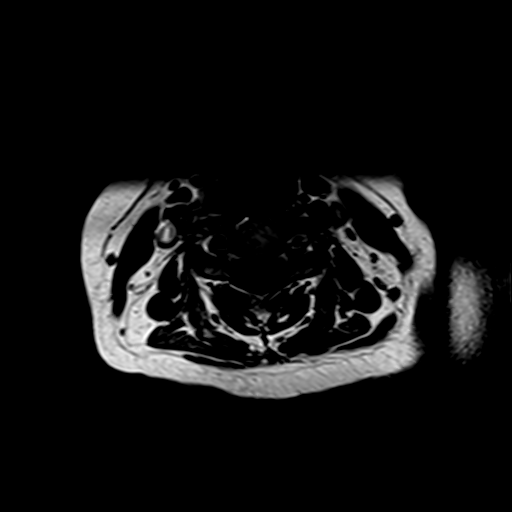
[im 35/40]
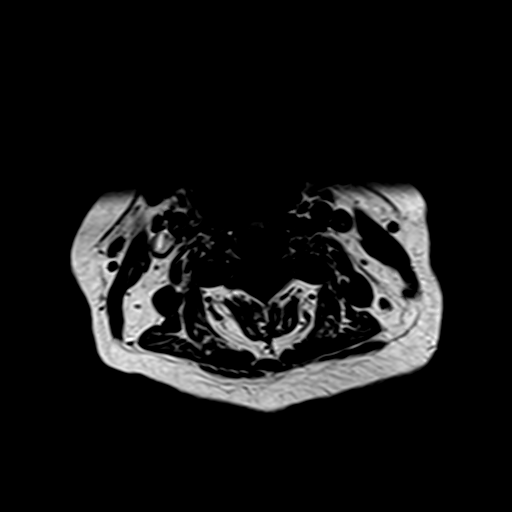

[Series 3: T1 fat-sat post-contrast · sagittal · 3.0mm · 0.43mm/px · 3 of 15 slices shown]
[im 3/15]
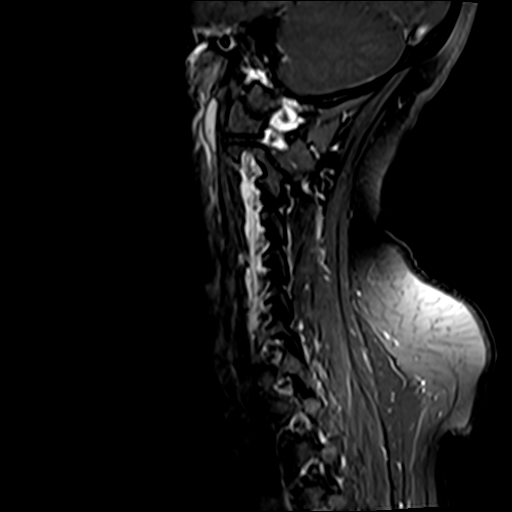
[im 8/15]
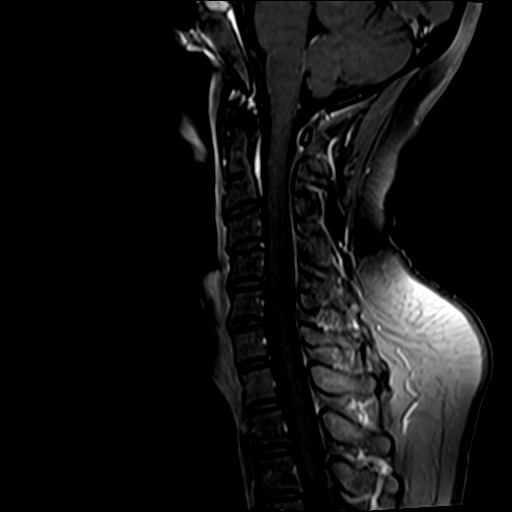
[im 12/15]
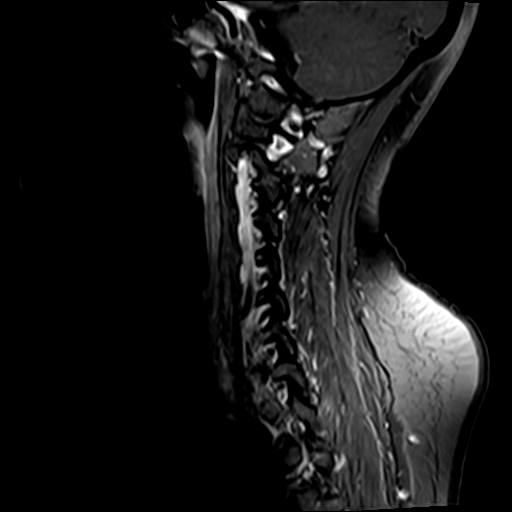

[Series 4: T1 post-contrast · axial · 3.0mm · 0.39mm/px · z∈[-304,-213]mm · 3 of 40 slices shown]
[im 5/40]
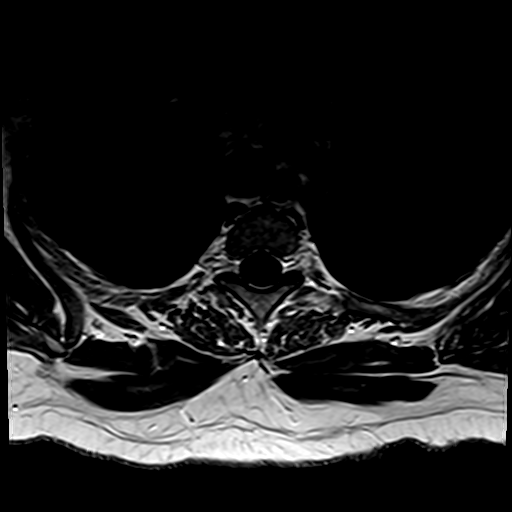
[im 20/40]
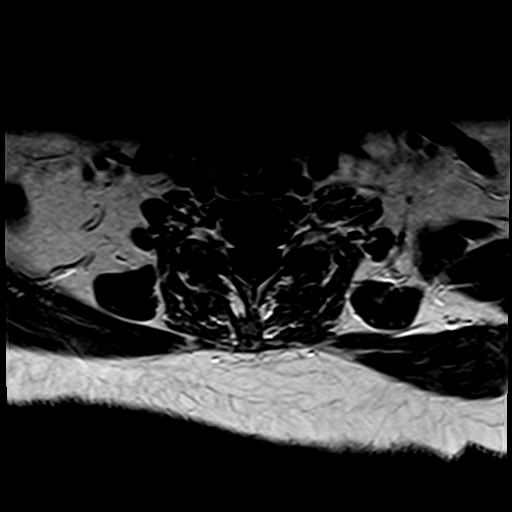
[im 35/40]
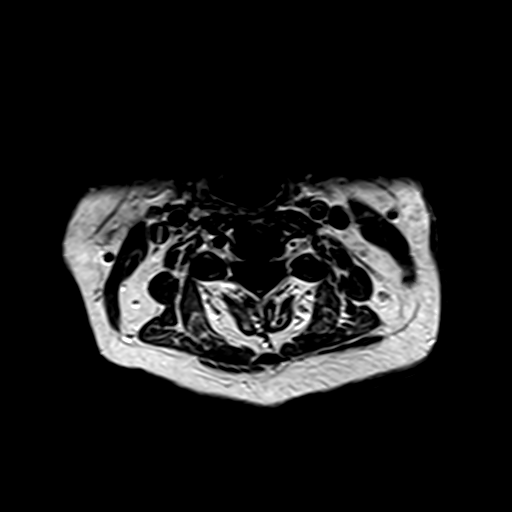

[23 of 48 positions shown; findings below may reference images not displayed]

FINDINGS: A contrast-enhanced MRI of the cervical spine was performed as a
follow-up to the recent prior noncontrast cervical spine MRI of
[DATE]. The following sequences were acquired on today's exam:
Sagittal T2 TSE sequence, axial T1 weighted precontrast sequence,
sagittal T1 weighted postcontrast sequence, axial T1 weighted
postcontrast sequence.

No enhancing lesions are identified within the cervical spinal cord.
IMPRESSION: No enhancing lesions are identified within the cervical spinal cord.

## 2020-12-14 IMAGING — MR MR HEAD W/ CM
3 of 4 series · 20 of 48 positions shown · IV contrast (Gadavist)
Comparison: Brain MRI [DATE].

CLINICAL DATA: Demyelinating disease. Evaluate demyelinating
disease.

EXAM:
MRI HEAD WITH CONTRAST Sagittal T1 weighted postcontrast sequence,
sagittal T2 weighted postcontrast sequence, axial T1 weighted
postcontrast sequence, coronal
TECHNIQUE: Multiplanar, multiecho pulse sequences of the brain and surrounding
structures were obtained with intravenous contrast.
CONTRAST:  8mL GADAVIST GADOBUTROL 1 MMOL/ML IV SOLN

[Series 6: t2_space_dark-fluid_sag_p2_ns-ir · sagittal · 1.0mm · 0.49mm/px · 11 of 160 slices shown]
[im 6/160]
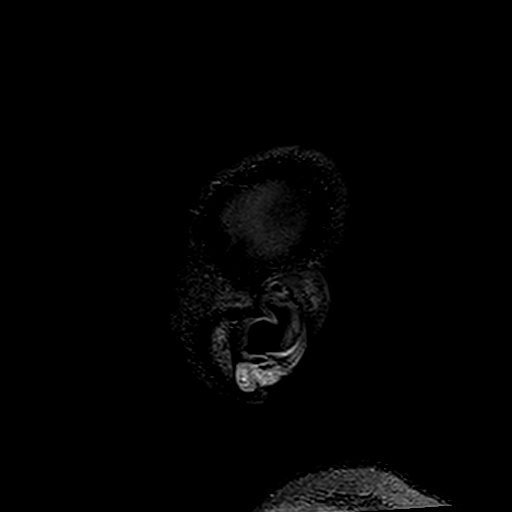
[im 24/160]
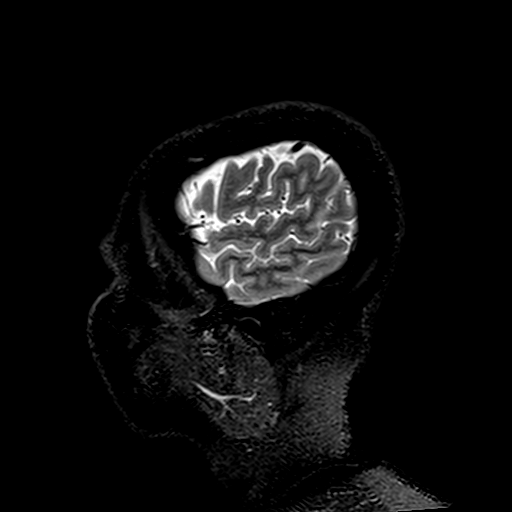
[im 30/160]
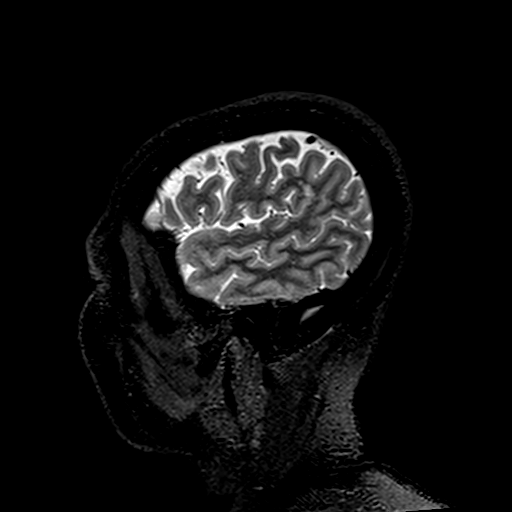
[im 48/160]
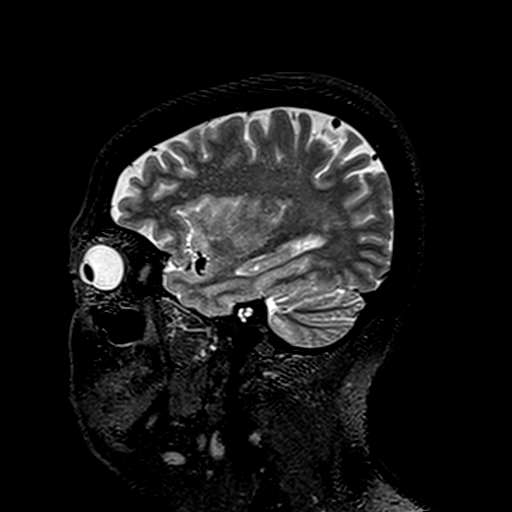
[im 71/160]
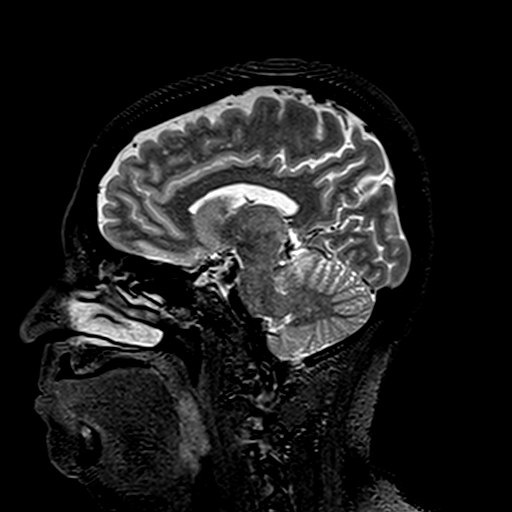
[im 83/160]
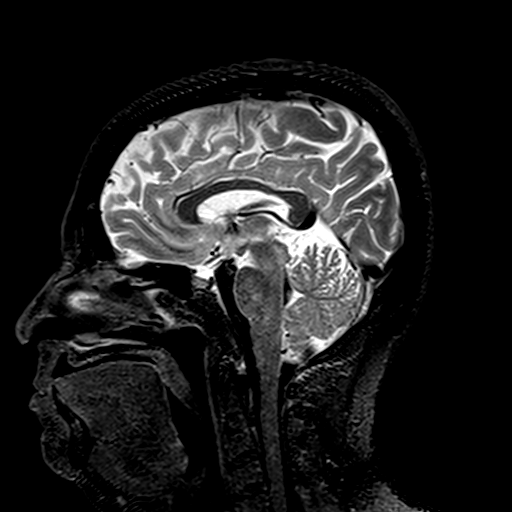
[im 89/160]
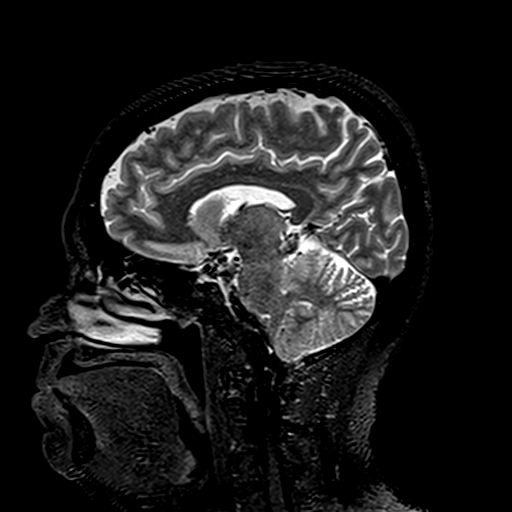
[im 112/160]
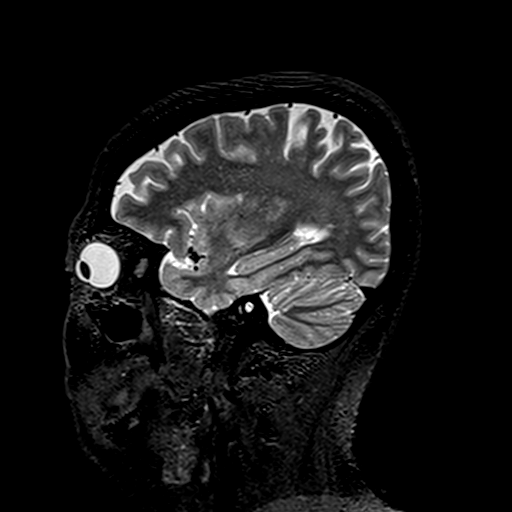
[im 130/160]
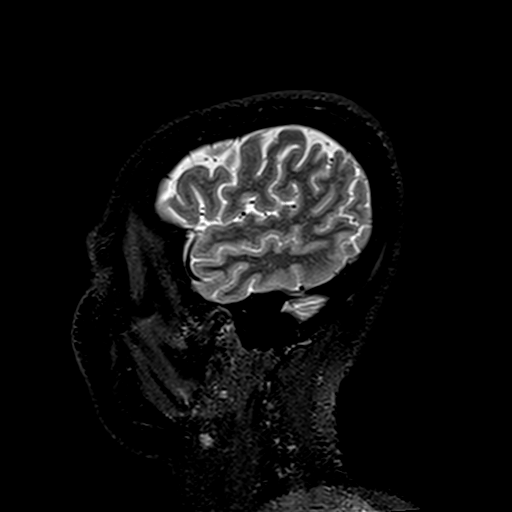
[im 136/160]
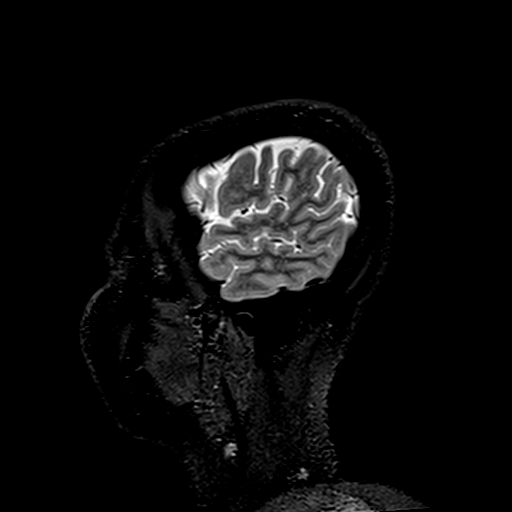
[im 154/160]
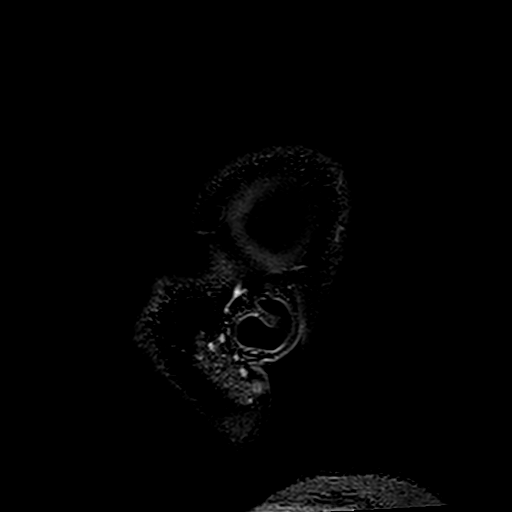

[Series 12: T1 post-contrast · coronal · 5.0mm · 0.34mm/px · 5 of 28 slices shown (1 of 2)]
[im 1/28]
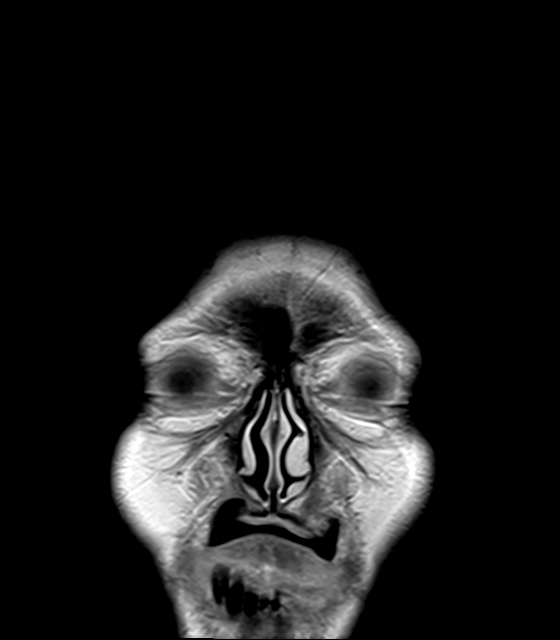
[im 7/28]
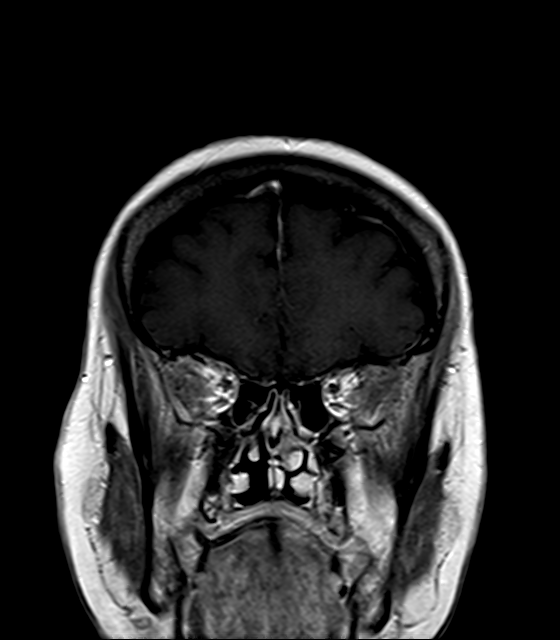
[im 14/28]
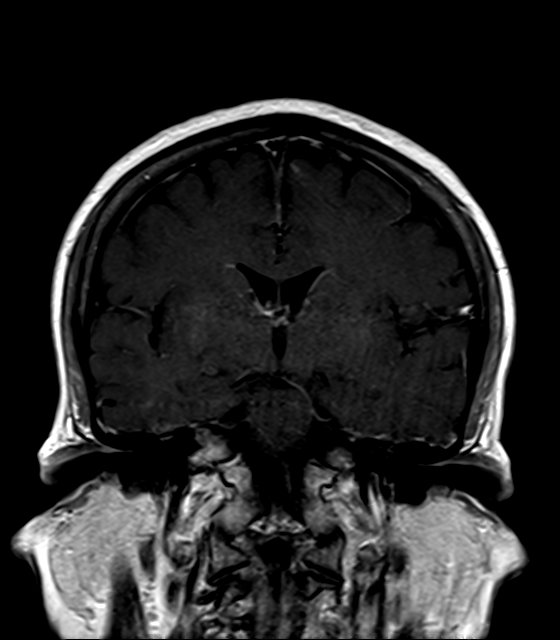
[im 21/28]
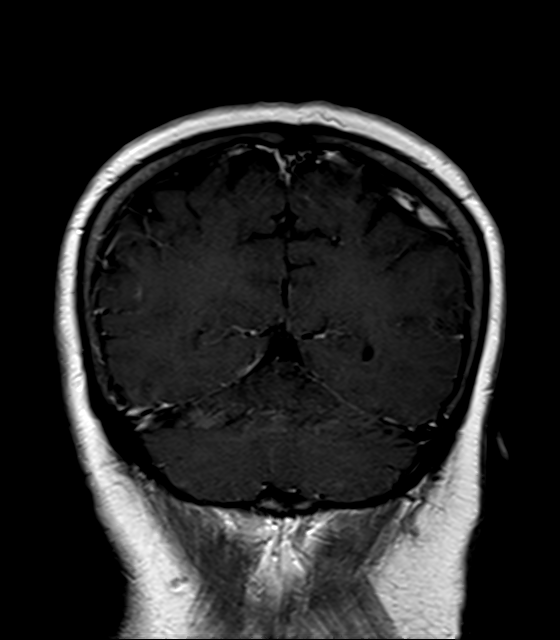
[im 28/28]
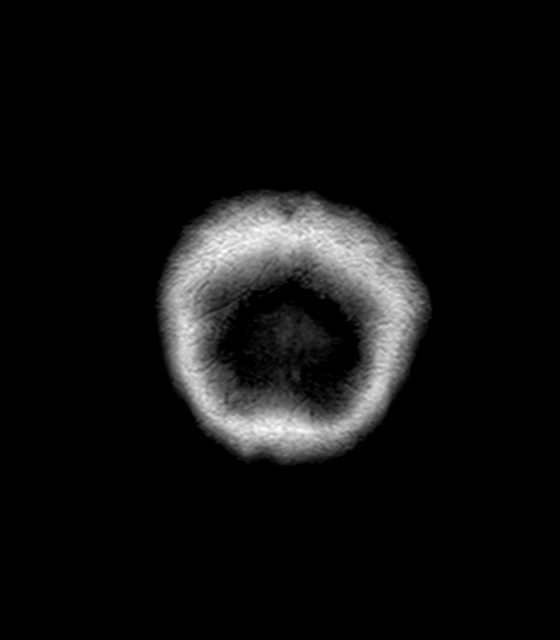

[Series 13: T1 post-contrast · sagittal · 5.0mm · 0.72mm/px · 4 of 23 slices shown (2 of 2)]
[im 1/23]
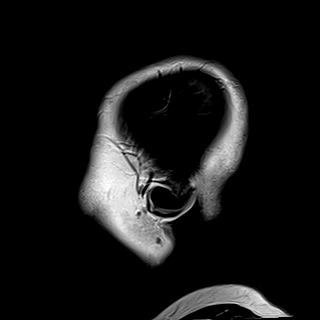
[im 8/23]
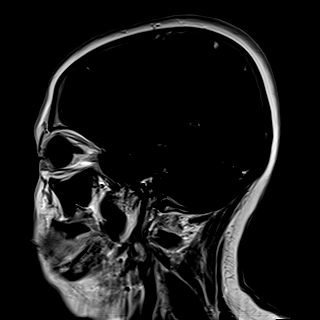
[im 15/23]
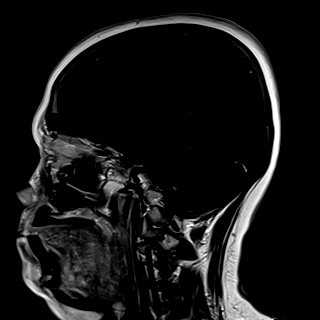
[im 23/23]
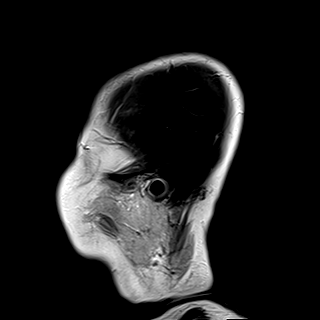

[20 of 48 positions shown; findings below may reference images not displayed]

FINDINGS: A contrast-enhanced brain MRI was performed as a follow-up to the
recent noncontrast brain MRI of [DATE]. The following sequences
were acquired on today's exam: Axial, coronal and sagittal T1
weighted postcontrast sequences, sagittal T2 weighted postcontrast
sequence.

There is patchy cortically based enhancement within the medial
mid-to-posterior left frontal lobe(ACA vascular territory). This is
likely secondary to the presence of recent infarcts at these sites.

Additional punctate focus of abnormal enhancement within the left
temporoparietal junction (series 11, image 31) (series 12, image 10)
(series 13, image 19).
IMPRESSION: Patchy cortically-based enhancement within the medial
mid-to-posterior left frontal lobe (ACA vascular territory). This is
likely secondary to the presence of recent infarcts at these sites.
There is an additional punctate focus of abnormal enhancement within
the left temporoparietal junction. This may also reflect a recent
cortical infarct. However, short interval 4-week contrast-enhanced
brain MRI follow-up is recommended to ensure unremarkable evolution
of these findings and to exclude alternative etiologies (such as
primary CNS neoplasm or intracranial metastatic disease).

## 2020-12-14 MED ORDER — GADOBUTROL 1 MMOL/ML IV SOLN
8.0000 mL | Freq: Once | INTRAVENOUS | Status: AC | PRN
  Administered 2020-12-14: 8 mL via INTRAVENOUS

## 2020-12-14 NOTE — Evaluation (Signed)
Speech Language Pathology Evaluation Patient Details Name: Mary Fritz MRN: [id number] DOB: 1960/10/19 Today's Date: 12/14/2020 Time: 0930-0950 SLP Time Calculation (min) (ACUTE ONLY): 20 min  Problem List:  Patient Active Problem List   Diagnosis Date Noted   Stroke [facility]) 12/12/2020   Past Medical History:  Past Medical History:  Diagnosis Date   CVA (cerebral vascular accident) (HCC) 2017   Hypertension    Past Surgical History:  Past Surgical History:  Procedure Laterality Date   CHOLECYSTECTOMY     EXPLORATORY LAPAROTOMY     HPI:  Pt is a 60 y.o. female admitted 9/23 after presenting to ED with c/o transient numbness and weakness RUE/LE. CT revealed small, likely nonacute lacunar infarction of the right corona radiata. 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 PMH: HTN   Assessment / Plan / Recommendation Clinical Impression  Patient assessed for speech, language, cognitive function s/p CVA. SLP performed assessment in patient's room with video interpreter. As per today's assessment, patient appears [facility] for cogntion and WNL for speech and language abilities. Memory, reasoning, problem solving all appeared [facility] and family member present in room feels that patient is at her baseline. SLP services not indicated at this time.    SLP Assessment  SLP Recommendation/Assessment: Patient does not need any further Speech Lanaguage Pathology Services SLP Visit Diagnosis: Cognitive communication deficit (R41.841)    Recommendations for follow up therapy are one component of a multi-disciplinary discharge planning process, led by the attending physician.  Recommendations may be updated  based on patient status, additional functional criteria and insurance authorization.    Follow Up Recommendations  None    Frequency and Duration           SLP Evaluation Cognition  Overall Cognitive Status: Within Functional Limits for tasks assessed Arousal/Alertness: Awake/alert Orientation Level: Oriented X4 Year: 2022 Month: September Day of Week: Correct Memory: Appears intact Awareness: Appears intact Problem Solving: Appears intact Safety/Judgment: Appears intact       Comprehension  Auditory Comprehension Overall Auditory Comprehension: Appears within functional limits for tasks assessed    Expression Expression Primary Mode of Expression: Verbal Verbal Expression Overall Verbal Expression: Appears within functional limits for tasks assessed   Oral / Motor  Oral Motor/Sensory Function Overall Oral Motor/Sensory Function: Within functional limits Motor Speech Overall Motor Speech: Appears within functional limits for tasks assessed   GO                    Angela Nevin, MA, CCC-SLP Speech Therapy [facility]

## 2020-12-14 NOTE — Progress Notes (Signed)
TCD w/ bubble study completed.   Please see CV Proc for preliminary results.   Patrice Moates, RDMS, RVT  

## 2020-12-14 NOTE — Discharge Summary (Addendum)
Physician Discharge Summary  Ladell Bey KDX:[id number] DOB: January 17, 1961 DOA: 12/12/2020  PCP: Bobbye Morton, MD  Admit date: 12/12/2020 Discharge date: 12/16/2020  Admitted From: Home Disposition: Home  Recommendations for Outpatient Follow-up:  Follow up with PCP in 1-2 weeks Follow-up with neurology Please obtain BMP/CBC in one week Please follow up on the following pending results: None  Home Health: No Equipment/Devices: Discharge Condition: Stable CODE STATUS: Full Diet recommendation: Heart Healthy / Carb Modified   Brief/Interim Summary:  Marillyn Goren is a 60 y.o. female with prior history of hypertension presently not on any medications after her primary care physician stopped medications 3 months ago started experiencing tingling and numbness of the right upper and lower extremity intermittently over the last 1 week.  Patient symptoms happened a month ago which lasted for few minutes and stopped.  Then the symptoms started again last week happen 4 or 5 times over the period of 5 to 6 days.  The symptoms are largely tingling and numbness of the right upper and lower extremity lasting for 5 minutes during which patient also feels weak and unable to ambulate.  Denies any associated visual symptoms or difficulty speaking or swallowing.  Symptoms resolved completely without any residual symptoms. Patient had gone to her primary care physician who ordered a CT head which showed possibility of nonacute stroke in the right corona radiata and was referred to the ER.   Initial CT head was negative for any acute stroke but did show remote lacunar strokes.  MRI with patchy small acute cortical/Subcortical infarcts within the medial posterior left frontal lobe and medial left parietal lobe, left ACA territory.  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   MRA head and neck with the flow Within the proximal A1 segment of left ACA  consistent with severe near-occlusive stenosis or an occlusion with distal reconstitution via the anterior communicating artery.  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   Neurology was also consulted-there is a concern of embolic phenomenon.  Transcranial bubble studies for some concern of grade 1 PFO with mild right-to-left shunt.  TEE was done which confirmed those findings.  No definitive cardiac source of embolism. ROPE score 5 only 34% chance that stroke is due to PFO and a 2-year recurrence of stroke/TIA risk of 7%.  She was also provided with loop recorder to rule out any possible arrhythmia which can cause embolic stroke.  Stroke work-up completed and she was started on DAPT with high intensity statin.  She needs to continue with DAPT for 3 weeks and then with Plavix only.  Neurologist can be able to make those changes as an outpatient.  Discharge Diagnoses:  Principal Problem:   Stroke [facility])   Discharge Instructions  Discharge Instructions     Ambulatory referral to Occupational Therapy   Complete by: As directed    Diet - low sodium heart healthy   Complete by: As directed    Discharge instructions   Complete by: As directed    It was pleasure taking care of you. Please take aspirin and Plavix together for 3 weeks and then continue only with Plavix. Had also been started on a new cholesterol medicine-please take it as directed. You are being given a heart monitor-follow the directions and follow-up with your cardiologist. Follow-up with your neurologist in 3 to 4 weeks.  Fue IT sales professional. Tome aspirina y Plavix juntos durante 3 semanas y [employer]  contine solo con Plavix. Tambin haba comenzado con un nuevo medicamento para el colesterol; tmelo segn las indicaciones. Le estn dando un monitor cardaco; siga las instrucciones y haga un seguimiento con su cardilogo. Seguimiento con su neurlogo en 3 a 4 semanas.   Increase activity  slowly   Complete by: As directed    Increase activity slowly   Complete by: As directed       Allergies as of 12/16/2020   No Known Allergies      Medication List     STOP taking these medications    ibuprofen 200 MG tablet Commonly known as: ADVIL       TAKE these medications    aspirin 81 MG EC tablet Take 1 tablet (81 mg total) by mouth daily. Swallow whole. Start taking on: December 17, 2020   atorvastatin 80 MG tablet Commonly known as: LIPITOR Take 1 tablet (80 mg total) by mouth daily. Start taking on: December 17, 2020   clopidogrel 75 MG tablet Commonly known as: PLAVIX Take 1 tablet (75 mg total) by mouth daily.        Follow-up Information     [facility] Office Follow up.   Specialty: Cardiology Why: 12/31/20 @ 2:00PM, wound check visit (heart monitor) Contact information: [address] [place] [address] [phone]        Street, Stephanie Coup, MD. Schedule an appointment as soon as possible for a visit.   Specialty: Family Medicine Contact information: [address] [place] [address] [phone]         Micki Riley, MD. Schedule an appointment as soon as possible for a visit in 2 week(s).   Specialties: Neurology, Radiology Contact information: [address] [place] [address] [phone]                No Known Allergies  Consultations: Neurology  Procedures/Studies: CT HEAD WO CONTRAST ( )  Result Date: 12/12/2020 CLINICAL DATA:  Right arm and leg numbness and heaviness for 1 month, increasingly frequent episodes EXAM: CT HEAD WITHOUT CONTRAST TECHNIQUE: Contiguous axial images were obtained from the base of the skull through the vertex without intravenous contrast. COMPARISON:  None. FINDINGS: Brain: No evidence of acute infarction, hemorrhage, hydrocephalus, extra-axial collection or mass lesion/mass effect. Small, likely nonacute  lacunar infarction of the right corona radiata (series 2, image 18) Vascular: No hyperdense vessel or unexpected calcification. Skull: Normal. Negative for fracture or focal lesion. Sinuses/Orbits: No acute finding. Other: None. IMPRESSION: 1. No acute intracranial pathology. 2. Small, likely nonacute lacunar infarction of the right corona radiata. 3. Consider MRI to more sensitively evaluate for acute diffusion restricting infarction if clinically suspected. These results will be called to the ordering clinician or representative by the Radiologist Assistant, and communication documented in the PACS or [employer]. Electronically Signed   By: Lauralyn Primes M.D.   On: 12/12/2020 14:34   MR ANGIO HEAD WO CONTRAST  Result Date: 12/13/2020 CLINICAL DATA:  Neuro deficit, acute, stroke suspected EXAM: MRA HEAD WITHOUT CONTRAST TECHNIQUE: Angiographic images of the Circle of Willis were acquired using MRA technique without intravenous contrast. COMPARISON:  Concurrently performed brain MRI 12/13/2020. FINDINGS: Anterior circulation: The intracranial internal carotid arteries are patent. The M1 middle cerebral arteries are patent. No M2 proximal branch occlusion or high-grade proximal stenosis is identified. Non dominant left anterior cerebral artery. Apparent flow gap within the proximal A1 segment of the left anterior cerebral artery, which  may reflect a severe near-occlusive stenosis or an occlusion with distal reconstitution via the anterior communicating artery. The anterior cerebral arteries are imaged to the proximal A5 segment level. The anterior cerebral arteries are otherwise patent to this level. Posterior circulation: The intracranial vertebral arteries are patent. Severe stenosis of the proximal V4 left vertebral artery versus artifact. The dominant right vertebral artery is patent intracranially without stenosis. The basilar artery is patent. The posterior cerebral arteries are patent. A left  posterior communicating artery is present. The right posterior communicating artery is hypoplastic or absent. Anatomic variants: As described. Impression #1 will be called to the ordering clinician or representative by the Radiologist Assistant, and communication documented in the PACS or [employer]. IMPRESSION: Apparent flow gap within the proximal A1 segment of the left anterior cerebral artery, which may reflect a severe near-occlusive stenosis or an occlusion with distal reconstitution via the anterior communicating artery. 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 Electronically Signed   By: Jackey Loge D.O.   On: 12/13/2020 12:13   MR ANGIO NECK W WO CONTRAST  Result Date: 12/13/2020 CLINICAL DATA:  Neuro deficit, acute, stroke suspected. EXAM: MRA NECK WITHOUT AND WITH CONTRAST TECHNIQUE: Multiplanar and multiecho pulse sequences of the neck were obtained without and with intravenous contrast. Angiographic images of the neck were obtained using MRA technique without and with intravenous contrast. CONTRAST:  8.19mL GADAVIST GADOBUTROL 1 MMOL/ML IV SOLN COMPARISON:  No pertinent prior exams available for comparison. FINDINGS: The examination is intermittently motion degraded, limiting evaluation. Common origin of the innominate and left common carotid arteries. The common carotid and internal carotid arteries are patent within the neck without hemodynamically significant stenosis (50% or greater). Mild atherosclerotic irregularity about the carotid bifurcations bilaterally. The vertebral arteries are patent within the neck. The right vertebral artery is dominant. Multiple sites of severe stenosis within the V1 and V2 left vertebral artery. IMPRESSION: Motion degraded examination. The common and internal carotid arteries are patent within the neck bilaterally without hemodynamically significant stenosis. Mild atherosclerotic irregularity about the carotid  bifurcations, bilaterally. Vertebral arteries patent within the neck. The right vertebral artery is dominant. Multiple sites of severe stenosis within the non dominant V1 and V2 left vertebral artery. Electronically Signed   By: Jackey Loge D.O.   On: 12/13/2020 12:03   MR BRAIN WO CONTRAST  Result Date: 12/13/2020 CLINICAL DATA:  Neuro deficit, acute, stroke suspected. EXAM: MRI HEAD WITHOUT CONTRAST TECHNIQUE: Multiplanar, multiecho pulse sequences of the brain and surrounding structures were obtained without intravenous contrast. COMPARISON:  Head CT 12/12/2020. FINDINGS: Brain: Cerebral volume is normal. There are patchy small acute cortical/subcortical infarcts within the medial posterior left frontal lobe and medial left parietal lobe (left ACA vascular territory). Chronic small-vessel infarcts within the right corona radiata and left basal ganglia. Background mild multifocal T2/FLAIR hyperintensity within the cerebral white matter, nonspecific but compatible with chronic small vessel ischemic disease. No evidence of an intracranial mass. No chronic intracranial blood products. No extra-axial fluid collection. No midline shift. Vascular: Maintained flow voids within the proximal large arterial vessels. Skull and upper cervical spine: No focal suspicious marrow lesion. Sinuses/Orbits: Visualized orbits show no acute finding. Trace mucosal thickening within the bilateral ethmoid and right maxillary sinuses. IMPRESSION: Patchy small acute cortical/subcortical infarcts within the medial posterior left frontal lobe and medial left parietal lobe (left ACA vascular territory). Chronic small-vessel infarcts within the right corona radiata and left basal ganglia. Background mild  chronic small-vessel ischemic changes within the cerebral white matter. Electronically Signed   By: Jackey Loge D.O.   On: 12/13/2020 11:58   MR BRAIN W CONTRAST  Result Date: 12/14/2020 CLINICAL DATA:  Demyelinating disease. Evaluate  demyelinating disease. EXAM: MRI HEAD WITH CONTRAST Sagittal T1 weighted postcontrast sequence, sagittal T2 weighted postcontrast sequence, axial T1 weighted postcontrast sequence, coronal TECHNIQUE: Multiplanar, multiecho pulse sequences of the brain and surrounding structures were obtained with intravenous contrast. CONTRAST:  8mL GADAVIST GADOBUTROL 1 MMOL/ML IV SOLN COMPARISON:  Brain MRI 12/13/2020. FINDINGS: A contrast-enhanced brain MRI was performed as a follow-up to the recent noncontrast brain MRI of 12/13/2020. The following sequences were acquired on today's exam: Axial, coronal and sagittal T1 weighted postcontrast sequences, sagittal T2 weighted postcontrast sequence. There is patchy cortically based enhancement within the medial mid-to-posterior left frontal lobe(ACA vascular territory). This is likely secondary to the presence of recent infarcts at these sites. Additional punctate focus of abnormal enhancement within the left temporoparietal junction (series 11, image 31) (series 12, image 10) (series 13, image 19). IMPRESSION: Patchy cortically-based enhancement within the medial mid-to-posterior left frontal lobe (ACA vascular territory). This is likely secondary to the presence of recent infarcts at these sites. There is an additional punctate focus of abnormal enhancement within the left temporoparietal junction. This may also reflect a recent cortical infarct. However, short interval 4-week contrast-enhanced brain MRI follow-up is recommended to ensure unremarkable evolution of these findings and to exclude alternative etiologies (such as primary CNS neoplasm or intracranial metastatic disease). Electronically Signed   By: Jackey Loge D.O.   On: 12/14/2020 13:29   MR CERVICAL SPINE WO CONTRAST  Result Date: 12/13/2020 CLINICAL DATA:  Cervical radiculopathy EXAM: MRI CERVICAL SPINE WITHOUT CONTRAST TECHNIQUE: Multiplanar, multisequence MR imaging of the cervical spine was performed. No  intravenous contrast was administered. COMPARISON:  None. FINDINGS: Technical Note: Despite efforts by the technologist and patient, motion artifact is present on today's exam and could not be eliminated. This reduces exam sensitivity and specificity. Alignment: Straightening of the cervical lordosis without significant listhesis. Vertebrae: No fracture, evidence of discitis, or bone lesion. The C2 and C3 levels are partially fused. Cord: No definite cord signal abnormality is evident within the limitations of this motion degraded exam. Posterior Fossa, vertebral arteries, paraspinal tissues: Please see dedicated concurrent MRI of the brain for evaluation of the posterior fossa structures. No significant abnormality is evident within the paravertebral soft tissues. Disc levels: C2-C3: No significant disc protrusion, foraminal stenosis, or canal stenosis. C3-C4: Shallow left paracentral disc protrusion. Mild left facet arthropathy. Borderline-mild canal stenosis. No significant foraminal stenosis. C4-C5: No definite disc protrusion. Bilateral facet arthropathy. No significant foraminal or canal stenosis. C5-C6: No definite disc protrusion. Mild facet arthropathy. No foraminal or canal stenosis. C6-C7: Shallow central disc protrusion. Mild bilateral facet arthropathy. No foraminal or canal stenosis. C7-T1: No significant disc protrusion, foraminal stenosis, or canal stenosis. IMPRESSION: 1. Motion degraded exam. 2. Mild degenerative changes of the cervical spine with borderline-mild canal stenosis at C3-C4. No appreciable foraminal stenosis at any level. Electronically Signed   By: Duanne Guess D.O.   On: 12/13/2020 12:00   MR CERVICAL SPINE W CONTRAST  Result Date: 12/14/2020 CLINICAL DATA:  Demyelinating disease. EXAM: MRI CERVICAL SPINE WITH CONTRAST TECHNIQUE: Multiplanar, multisequence MR imaging of the cervical spine was performed following the administration of intravenous contrast. COMPARISON:  MRI of  the cervical spine 12/13/2020. FINDINGS: A contrast-enhanced MRI of the cervical spine was performed as a  follow-up to the recent prior noncontrast cervical spine MRI of 12/13/2020. The following sequences were acquired on today's exam: Sagittal T2 TSE sequence, axial T1 weighted precontrast sequence, sagittal T1 weighted postcontrast sequence, axial T1 weighted postcontrast sequence. No enhancing lesions are identified within the cervical spinal cord. IMPRESSION: No enhancing lesions are identified within the cervical spinal cord. Electronically Signed   By: Jackey Loge D.O.   On: 12/14/2020 13:32   VAS [place] TRANSCRANIAL DOPPLER W BUBBLES  Result Date: 12/15/2020  Transcranial Doppler with Bubble Patient Name:  DENASIA VENN  Date of Exam:   12/14/2020 Medical Rec #: [id number]         Accession #:    [id number] Date of Birth: [birth year]        Patient Gender: F Patient Age:   60 years Exam Location:  [facility] Procedure:      VAS [place] TRANSCRANIAL DOPPLER W BUBBLES Referring Phys: Arnetha Courser --------------------------------------------------------------------------------  Indications: Stroke. Comparison Study: No prior studies. Performing Technologist: Jean Rosenthal RDMS, RVT  Examination Guidelines: A complete evaluation includes B-mode imaging, spectral Doppler, color Doppler, and power Doppler as needed of all accessible portions of each vessel. Bilateral testing is considered an integral part of a complete examination. Limited examinations for reoccurring indications may be performed as noted.  Summary:  A vascular evaluation was performed. The left middle cerebral artery was studied. An IV was inserted into the patient's left forearm. Verbal informed consent was obtained.  Less than 10 high intensity transient signals (HITS) were heard at rest and with valsalva, indicating a spencer grade 1 patent foramen ovale (PFO). Positive TCD Bubble study indicative of a small right to left shunt *See  table(s) above for TCD measurements and observations.  Diagnosing physician: Delia Heady MD Electronically signed by Delia Heady MD on 12/15/2020 at 12:45:39 PM.    Final    ECHOCARDIOGRAM COMPLETE  Result Date: 12/14/2020    ECHOCARDIOGRAM REPORT   Patient Name:   PALAK TERCERO Date of Exam: 12/14/2020 Medical Rec #:  [id number]        Height:       60.0 in Accession #:    [id number]       Weight:       187.6 lb Date of Birth:  1960-12-08       BSA:          1.817 m Patient Age:    59 years         BP:           170/79 mmHg Patient Gender: F                HR:           74 bpm. Exam Location:  Inpatient Procedure: 2D Echo, Cardiac Doppler and Color Doppler Indications:    Stroke  History:        Patient has prior history of Echocardiogram examinations and                 Patient has no prior history of Echocardiogram examinations.                 Stroke.  Sonographer:    Sheralyn Boatman RDCS Referring Phys: [id number] [facility]  Sonographer Comments: Technically difficult study due to poor echo windows. IMPRESSIONS  1. Left ventricular ejection fraction, by estimation, is 60 to 65%. The left ventricle has normal function. The left ventricle has no regional wall motion abnormalities. Left ventricular diastolic parameters  are consistent with Grade I diastolic dysfunction (impaired relaxation).  2. Right ventricular systolic function is normal. The right ventricular size is normal. There is normal pulmonary artery systolic pressure.  3. The mitral valve is normal in structure. Trivial mitral valve regurgitation. No evidence of mitral stenosis.  4. The aortic valve is tricuspid. There is mild calcification of the aortic valve. Aortic valve regurgitation is trivial. Mild aortic valve sclerosis is present, with no evidence of aortic valve stenosis.  5. The inferior vena cava is normal in size with greater than 50% respiratory variability, suggesting right atrial pressure of 3 mmHg. FINDINGS  Left Ventricle: Left  ventricular ejection fraction, by estimation, is 60 to 65%. The left ventricle has normal function. The left ventricle has no regional wall motion abnormalities. The left ventricular internal cavity size was normal in size. There is  no left ventricular hypertrophy. Left ventricular diastolic parameters are consistent with Grade I diastolic dysfunction (impaired relaxation). Right Ventricle: The right ventricular size is normal. No increase in right ventricular wall thickness. Right ventricular systolic function is normal. There is normal pulmonary artery systolic pressure. The tricuspid regurgitant velocity is 2.29 m/s, and  with an assumed right atrial pressure of 3 mmHg, the estimated right ventricular systolic pressure is 24.0 mmHg. Left Atrium: Left atrial size was normal in size. Right Atrium: Right atrial size was normal in size. Pericardium: There is no evidence of pericardial effusion. Mitral Valve: The mitral valve is normal in structure. Trivial mitral valve regurgitation. No evidence of mitral valve stenosis. Tricuspid Valve: The tricuspid valve is normal in structure. Tricuspid valve regurgitation is trivial. No evidence of tricuspid stenosis. Aortic Valve: The aortic valve is tricuspid. There is mild calcification of the aortic valve. Aortic valve regurgitation is trivial. Mild aortic valve sclerosis is present, with no evidence of aortic valve stenosis. Pulmonic Valve: The pulmonic valve was normal in structure. Pulmonic valve regurgitation is mild. No evidence of pulmonic stenosis. Aorta: The aortic root is normal in size and structure. Venous: The inferior vena cava is normal in size with greater than 50% respiratory variability, suggesting right atrial pressure of 3 mmHg. IAS/Shunts: No atrial level shunt detected by color flow Doppler.  LEFT VENTRICLE PLAX 2D LVIDd:         3.60 cm     Diastology LVIDs:         2.00 cm     LV e' medial:    5.55 cm/s LV PW:         1.10 cm     LV E/e' medial:  12.8  LV IVS:        1.10 cm     LV e' lateral:   8.49 cm/s LVOT diam:     1.90 cm     LV E/e' lateral: 8.4 LV SV:         69 LV SV Index:   38 LVOT Area:     2.84 cm  LV Volumes (MOD) LV vol d, MOD A2C: 38.5 ml LV vol d, MOD A4C: 43.1 ml LV vol s, MOD A2C: 13.7 ml LV vol s, MOD A4C: 17.6 ml LV SV MOD A2C:     24.8 ml LV SV MOD A4C:     43.1 ml LV SV MOD BP:      25.1 ml RIGHT VENTRICLE             IVC RV S prime:     13.80 cm/s  IVC diam: 1.60 cm TAPSE (M-mode):  2.0 cm LEFT ATRIUM             Index       RIGHT ATRIUM           Index LA diam:        3.10 cm 1.71 cm/m  RA Area:     11.20 cm LA Vol (A2C):   51.6 ml 28.41 ml/m RA Volume:   29.60 ml  16.29 ml/m LA Vol (A4C):   17.6 ml 9.69 ml/m LA Biplane Vol: 32.0 ml 17.62 ml/m  AORTIC VALVE             PULMONIC VALVE LVOT Vmax:   117.00 cm/s PR End Diast Vel: 1.11 msec LVOT Vmean:  75.900 cm/s LVOT VTI:    0.242 m  AORTA Ao Root diam: 3.50 cm Ao Asc diam:  3.30 cm MITRAL VALVE                TRICUSPID VALVE MV Area (PHT): 3.12 cm     TR Peak grad:   21.0 mmHg MV Decel Time: 243 msec     TR Vmax:        229.00 cm/s MV E velocity: 71.10 cm/s MV A velocity: 109.00 cm/s  SHUNTS MV E/A ratio:  0.65         Systemic VTI:  0.24 m                             Systemic Diam: 1.90 cm Charlton Haws MD Electronically signed by Charlton Haws MD Signature Date/Time: 12/14/2020/4:28:51 PM    Final    ECHO TEE  Result Date: 12/16/2020    TRANSESOPHOGEAL ECHO REPORT   Patient Name:   SHARMAN GARROTT Date of Exam: 12/16/2020 Medical Rec #:  [id number]        Height:       60.0 in Accession #:    [id number]       Weight:       187.6 lb Date of Birth:  12/24/1960       BSA:          1.817 m Patient Age:    59 years         BP:           105/52 mmHg Patient Gender: F                HR:           78 bpm. Exam Location:  Inpatient Procedure: 2D Echo, Cardiac Doppler and Color Doppler Indications:     Stroke  History:         Patient has prior history of Echocardiogram examinations, most                   recent 12/14/2020.  Sonographer:     Roosvelt Maser RDCS Referring Phys:  [id number] RHONDA G BARRETT Diagnosing Phys: Olga Millers MD PROCEDURE: After discussion of the risks and benefits of a TEE, an informed consent was obtained from the patient. The transesophogeal probe was passed without difficulty through the esophogus of the patient. Local oropharyngeal anesthetic was provided with Cetacaine. Sedation performed by different physician. The patient was monitored while under deep sedation. Anesthestetic sedation was provided intravenously by Anesthesiology: 234mg  of Propofol. The patient's vital signs; including heart rate, blood  pressure, and oxygen saturation; remained stable throughout the procedure. The patient developed no complications during the procedure. IMPRESSIONS  1. 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  2. Left ventricular ejection fraction, by estimation, is 60 to 65%. The left ventricle has normal function. The left ventricle has no regional wall motion abnormalities. There is mild left ventricular hypertrophy of the basal-septal segment.  3. Right ventricular systolic function is normal. The right ventricular size is normal.  4. No left atrial/left atrial appendage thrombus was detected.  5. The mitral valve is normal in structure. No evidence of mitral valve regurgitation.  6. The aortic valve is tricuspid. Aortic valve regurgitation is trivial.  7. 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  8. Agitated saline contrast bubble study was positive with shunting observed within 3-6 cardiac cycles suggestive of interatrial shunt. FINDINGS  Left Ventricle: Left ventricular ejection fraction, by estimation, is 60 to 65%. The left ventricle has normal function. The left ventricle has no regional wall motion abnormalities. The left  ventricular internal cavity size was normal in size. There is  mild left ventricular hypertrophy of the basal-septal segment. Right Ventricle: The right ventricular size is normal. Right ventricular systolic function is normal. Left Atrium: Left atrial size was normal in size. No left atrial/left atrial appendage thrombus was detected. Right Atrium: Right atrial size was normal in size. Pericardium: There is no evidence of pericardial effusion. Mitral Valve: The mitral valve is normal in structure. No evidence of mitral valve regurgitation. Tricuspid Valve: The tricuspid valve is normal in structure. Tricuspid valve regurgitation is trivial. Aortic Valve: The aortic valve is tricuspid. Aortic valve regurgitation is trivial. Pulmonic Valve: The pulmonic valve was normal in structure. Pulmonic valve regurgitation is trivial. Aorta: The aortic root is normal in size and structure. There is moderate (Grade III) plaque involving the descending aorta. IAS/Shunts: No atrial level shunt detected by color flow Doppler. Agitated saline contrast bubble study was positive with shunting observed within 3-6 cardiac cycles suggestive of interatrial shunt. 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 Olga Millers MD Electronically signed by Olga Millers MD Signature Date/Time: 12/16/2020/1:26:01 PM    Final    VAS [place] LOWER EXTREMITY VENOUS (DVT)  Result Date: 12/16/2020  Lower Venous DVT Study Patient Name:  EDWENA MAYORGA  Date of Exam:   12/16/2020 Medical Rec #: [id number]         Accession #:    [id number] Date of Birth: 05-Apr-1960        Patient Gender: F Patient Age:   51 years Exam Location:  [facility] Procedure:      VAS [place] LOWER EXTREMITY VENOUS (DVT) Referring Phys: PRAMOD SETHI --------------------------------------------------------------------------------   Indications: Edema.  Comparison Study: No prior study Performing Technologist: Gertie Fey MHA, RDMS, RVT, RDCS  Examination Guidelines: A complete evaluation includes B-mode imaging, spectral Doppler, color Doppler, and power Doppler as needed of all accessible portions of each vessel. Bilateral testing is considered an integral part of a complete examination. Limited examinations for reoccurring indications may be performed as noted. The reflux portion of the exam is performed with the patient in reverse Trendelenburg.  +---------+---------------+---------+-----------+----------+--------------+ RIGHT    CompressibilityPhasicitySpontaneityPropertiesThrombus Aging +---------+---------------+---------+-----------+----------+--------------+ CFV      Full           Yes      Yes                                 +---------+---------------+---------+-----------+----------+--------------+  SFJ      Full                                                        +---------+---------------+---------+-----------+----------+--------------+ FV Prox  Full                                                        +---------+---------------+---------+-----------+----------+--------------+ FV Mid   Full                                                        +---------+---------------+---------+-----------+----------+--------------+ FV DistalFull                                                        +---------+---------------+---------+-----------+----------+--------------+ PFV      Full                                                        +---------+---------------+---------+-----------+----------+--------------+ POP      Full           Yes      Yes                                 +---------+---------------+---------+-----------+----------+--------------+ PTV      Full                                                         +---------+---------------+---------+-----------+----------+--------------+ PERO     Full                                                        +---------+---------------+---------+-----------+----------+--------------+   +---------+---------------+---------+-----------+----------+--------------+ LEFT     CompressibilityPhasicitySpontaneityPropertiesThrombus Aging +---------+---------------+---------+-----------+----------+--------------+ CFV      Full           Yes      Yes                                 +---------+---------------+---------+-----------+----------+--------------+ SFJ      Full                                                        +---------+---------------+---------+-----------+----------+--------------+  FV Prox  Full                                                        +---------+---------------+---------+-----------+----------+--------------+ FV Mid   Full                                                        +---------+---------------+---------+-----------+----------+--------------+ FV DistalFull                                                        +---------+---------------+---------+-----------+----------+--------------+ PFV      Full                                                        +---------+---------------+---------+-----------+----------+--------------+ POP      Full           Yes      Yes                                 +---------+---------------+---------+-----------+----------+--------------+ PTV      Full                                                        +---------+---------------+---------+-----------+----------+--------------+ PERO     Full                                                        +---------+---------------+---------+-----------+----------+--------------+     Summary: RIGHT: - There is no evidence of deep vein thrombosis in the lower extremity.  - No cystic structure found in  the popliteal fossa.  LEFT: - There is no evidence of deep vein thrombosis in the lower extremity.  - No cystic structure found in the popliteal fossa.  *See table(s) above for measurements and observations. Electronically signed by Waverly Ferrari MD on 12/16/2020 at 3:44:59 PM.    Final     Subjective: Patient was seen and examined today.  No new complaints or symptoms.  Son at bedside.  She wants to go home.  All questions answered to the best of my ability. Patient is a Spanish speaking lady, an interpreter was used for communication.  Discharge Exam: Vitals:   12/16/20 1425 12/16/20 1552  BP: (!) 138/49 (!) 142/84  Pulse: 73 91  Resp: 16 17  Temp: (!) 97.4 F (36.3 C) 98 F (36.7 C)  SpO2: 98% 98%   Vitals:   12/16/20 1057 12/16/20 1212 12/16/20  1425 12/16/20 1552  BP: (!) 129/55 134/61 (!) 138/49 (!) 142/84  Pulse: 68 64 73 91  Resp: 15 15 16 17   Temp:  (!) 97.4 F (36.3 C) (!) 97.4 F (36.3 C) 98 F (36.7 C)  TempSrc:  Oral Oral Oral  SpO2: 99% 98% 98% 98%  Weight:      Height:        General: Pt is alert, awake, not in acute distress Cardiovascular: RRR, S1/S2 +, no rubs, no gallops Respiratory: CTA bilaterally, no wheezing, no rhonchi Abdominal: Soft, NT, ND, bowel sounds + Extremities: no edema, no cyanosis   The results of significant diagnostics from this hospitalization (including imaging, microbiology, ancillary and laboratory) are listed below for reference.    Microbiology: Recent Results (from the past 240 hour(s))  Resp Panel by RT-PCR (Flu A&B, Covid) Nasopharyngeal Swab     Status: None   Collection Time: 12/12/20  5:48 PM   Specimen: Nasopharyngeal Swab; Nasopharyngeal(NP) swabs in vial transport medium  Result Value Ref Range Status   SARS Coronavirus 2 by RT PCR NEGATIVE NEGATIVE Final    Comment: (NOTE) SARS-CoV-2 target nucleic acids are NOT DETECTED.  The SARS-CoV-2 RNA is generally detectable in upper respiratory specimens during the  acute phase of infection. The lowest concentration of SARS-CoV-2 viral copies this assay can detect is 138 copies/mL. A negative result does not preclude SARS-Cov-2 infection and should not be used as the sole basis for treatment or other patient management decisions. A negative result may occur with  improper specimen collection/handling, submission of specimen other than nasopharyngeal swab, presence of viral mutation(s) within the areas targeted by this assay, and inadequate number of viral copies(<138 copies/mL). A negative result must be combined with clinical observations, patient history, and epidemiological information. The expected result is Negative.  Fact Sheet for Patients:  BloggerCourse.com  Fact Sheet for Healthcare Providers:  SeriousBroker.it  This test is no t yet approved or cleared by the [place] FDA and  has been authorized for detection and/or diagnosis of SARS-CoV-2 by FDA under an Emergency Use Authorization (EUA). This EUA will remain  in effect (meaning this test can be used) for the duration of the COVID-19 declaration under Section 564(b)(1) of the Act, 21 U.S.C.section 360bbb-3(b)(1), unless the authorization is terminated  or revoked sooner.       Influenza A by PCR NEGATIVE NEGATIVE Final   Influenza B by PCR NEGATIVE NEGATIVE Final    Comment: (NOTE) The Xpert Xpress SARS-CoV-2/FLU/RSV plus assay is intended as an aid in the diagnosis of influenza from Nasopharyngeal swab specimens and should not be used as a sole basis for treatment. Nasal washings and aspirates are unacceptable for Xpert Xpress SARS-CoV-2/FLU/RSV testing.  Fact Sheet for Patients: BloggerCourse.com  Fact Sheet for Healthcare Providers: SeriousBroker.it  This test is not yet approved or cleared by the [place] FDA and has been authorized for detection and/or  diagnosis of SARS-CoV-2 by FDA under an Emergency Use Authorization (EUA). This EUA will remain in effect (meaning this test can be used) for the duration of the COVID-19 declaration under Section 564(b)(1) of the Act, 21 U.S.C. section 360bbb-3(b)(1), unless the authorization is terminated or revoked.  Performed at [facility], [address] [place], [place] [address]      Labs: BNP (last 3 results) No results for input(s): BNP in the last 8760 hours. Basic Metabolic Panel: Recent Labs  Lab 12/12/20 1748 12/13/20 0559  NA 138 137  K 3.9 4.0  CL 103 104  CO2 25 26  GLUCOSE 88 109*  BUN 12 10  CREATININE 0.67 0.74  CALCIUM 9.9 9.0   Liver Function Tests: Recent Labs  Lab 12/13/20 0559  AST 28  ALT 23  ALKPHOS 53  BILITOT 0.9  PROT 7.0  ALBUMIN 3.5   No results for input(s): LIPASE, AMYLASE in the last 168 hours. No results for input(s): AMMONIA in the last 168 hours. CBC: Recent Labs  Lab 12/12/20 1748 12/13/20 0559  WBC 10.6* 8.3  NEUTROABS 6.2  --   HGB 14.4 13.2  HCT 43.6 39.4  MCV 87.7 89.3  PLT 317 292   Cardiac Enzymes: No results for input(s): CKTOTAL, CKMB, CKMBINDEX, TROPONINI in the last 168 hours. BNP: Invalid input(s): POCBNP CBG: No results for input(s): GLUCAP in the last 168 hours. D-Dimer No results for input(s): DDIMER in the last 72 hours. Hgb A1c No results for input(s): HGBA1C in the last 72 hours.  Lipid Profile No results for input(s): CHOL, HDL, LDLCALC, TRIG, CHOLHDL, LDLDIRECT in the last 72 hours.  Thyroid function studies No results for input(s): TSH, T4TOTAL, T3FREE, THYROIDAB in the last 72 hours.  Invalid input(s): FREET3 Anemia work up No results for input(s): VITAMINB12, FOLATE, FERRITIN, TIBC, IRON, RETICCTPCT in the last 72 hours. Urinalysis No results found for: COLORURINE, APPEARANCEUR, LABSPEC, PHURINE, GLUCOSEU, HGBUR, BILIRUBINUR, KETONESUR, PROTEINUR, UROBILINOGEN, NITRITE,  LEUKOCYTESUR Sepsis Labs Invalid input(s): PROCALCITONIN,  WBC,  LACTICIDVEN Microbiology Recent Results (from the past 240 hour(s))  Resp Panel by RT-PCR (Flu A&B, Covid) Nasopharyngeal Swab     Status: None   Collection Time: 12/12/20  5:48 PM   Specimen: Nasopharyngeal Swab; Nasopharyngeal(NP) swabs in vial transport medium  Result Value Ref Range Status   SARS Coronavirus 2 by RT PCR NEGATIVE NEGATIVE Final    Comment: (NOTE) SARS-CoV-2 target nucleic acids are NOT DETECTED.  The SARS-CoV-2 RNA is generally detectable in upper respiratory specimens during the acute phase of infection. The lowest concentration of SARS-CoV-2 viral copies this assay can detect is 138 copies/mL. A negative result does not preclude SARS-Cov-2 infection and should not be used as the sole basis for treatment or other patient management decisions. A negative result may occur with  improper specimen collection/handling, submission of specimen other than nasopharyngeal swab, presence of viral mutation(s) within the areas targeted by this assay, and inadequate number of viral copies(<138 copies/mL). A negative result must be combined with clinical observations, patient history, and epidemiological information. The expected result is Negative.  Fact Sheet for Patients:  BloggerCourse.com  Fact Sheet for Healthcare Providers:  SeriousBroker.it  This test is no t yet approved or cleared by the [place] FDA and  has been authorized for detection and/or diagnosis of SARS-CoV-2 by FDA under an Emergency Use Authorization (EUA). This EUA will remain  in effect (meaning this test can be used) for the duration of the COVID-19 declaration under Section 564(b)(1) of the Act, 21 U.S.C.section 360bbb-3(b)(1), unless the authorization is terminated  or revoked sooner.       Influenza A by PCR NEGATIVE NEGATIVE Final   Influenza B by PCR NEGATIVE NEGATIVE  Final    Comment: (NOTE) The Xpert Xpress SARS-CoV-2/FLU/RSV plus assay is intended as an aid in the diagnosis of influenza from Nasopharyngeal swab specimens and should not be used as a sole basis for treatment. Nasal washings and aspirates are unacceptable for Xpert Xpress SARS-CoV-2/FLU/RSV testing.  Fact Sheet for Patients: BloggerCourse.com  Fact Sheet for Healthcare Providers: SeriousBroker.it  This test is not yet approved or cleared by the [place] and has been authorized for detection and/or diagnosis of SARS-CoV-2 by FDA under an Emergency Use Authorization (EUA). This EUA will remain in effect (meaning this test can be used) for the duration of the COVID-19 declaration under Section 564(b)(1) of the Act, 21 U.S.C. section 360bbb-3(b)(1), unless the authorization is terminated or revoked.  Performed at [facility], [address] [place], [place] [address]     Time coordinating discharge: Over 30 minutes  SIGNED:  Arnetha Courser, MD  [facility] 12/16/2020, 5:13 PM  If 7PM-7AM, please contact night-coverage [email or web address]  This record has been created using Conservation officer, historic buildings. Errors have been sought and corrected,but may not always be located. Such creation errors do not reflect on the standard of care.

## 2020-12-14 NOTE — Progress Notes (Signed)
PROGRESS NOTE    Mary Fritz  ION:[id number] DOB: 05/11/1960 DOA: 12/12/2020 PCP: Casper Harrison, Stephanie Coup, MD   Brief Narrative: Taken from prior notes.  Mary Fritz is a 60 y.o. female with prior history of hypertension presently not on any medications after her primary care physician stopped medications 3 months ago started experiencing tingling and numbness of the right upper and lower extremity intermittently over the last 1 week.  Patient symptoms happened a month ago which lasted for few minutes and stopped.  Then the symptoms started again last week happen 4 or 5 times over the period of 5 to 6 days.  The symptoms are largely tingling and numbness of the right upper and lower extremity lasting for 5 minutes during which patient also feels weak and unable to ambulate.  Denies any associated visual symptoms or difficulty speaking or swallowing.  Symptoms resolved completely without any residual symptoms. Patient had gone to her primary care physician who ordered a CT head which showed possibility of nonacute stroke in the right corona radiata and was referred to the ER.   Initial CT head was negative for any acute stroke but did show remote lacunar strokes.  MRI with patchy small acute cortical/Subcortical infarcts within the medial posterior left frontal lobe and medial left parietal lobe, left ACA territory.  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 Echocardiogram with an normal limit but transcranial bubble studies with some concern of grade 1 patent foramen ovale.  Patient might need TEE and or loop recorder.  Neurology is concerned about cryptogenic/embolic  stroke.  Subjective: Patient was seen and examined today.  No new complaints or symptoms.  She wants to go home.  Son at bedside.  She is a Spanish-speaking lady and all communication was done with the help of an interpreter.  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  Neurology is on board.  Basic stroke work-up completed but patient will need TEE and loop recorder before discharge. -We will consult cardiology. -Continue with DAPT for 3 weeks -Continue with statin  Hypertension.  Patient was not on any antihypertensives at home.  Blood pressure currently elevated. -Permissive hypertension up to 200 systolic per neurology. -We will start her on antihypertensives tomorrow if blood pressure remains elevated.  Objective: Vitals:   12/14/20 0344 12/14/20 0738 12/14/20 1116 12/14/20 1527  BP: 111/66 (!) 137/58 (!) 167/67 (!) 170/79  Pulse: 62 64 74 75  Resp: 17 18 (!) 22 (!) 23  Temp: 98.2 F (36.8 C) 97.9 F (36.6 C) 98.4 F (36.9 C) 98.7 F (37.1 C)  TempSrc: Oral Oral Oral Oral  SpO2: 99% 98% 97% 97%  Weight:      Height:       No intake or output data in the 24 hours ending 12/14/20 1736 Filed Weights   12/13/20 0141 12/13/20 0516  Weight: 88.6 kg 85.1 kg    Examination:  General exam: Appears calm and comfortable  Respiratory system: Clear to auscultation. Respiratory effort normal. Cardiovascular system: S1 & S2 heard, RRR. No JVD, murmurs, rubs, gallops or clicks. Gastrointestinal system: Soft, nontender, nondistended, bowel  sounds positive. Central nervous system: Alert and oriented. No focal neurological deficits.Symmetric 5 x 5 power. Extremities: No edema, no cyanosis, pulses intact and symmetrical. Psychiatry: Judgement and insight appear normal.    DVT prophylaxis: Lovenox Code Status: Full Family Communication: Discussed with son at bedside Disposition  Plan:  Status is: Observation  The patient will require care spanning > 2 midnights and should be moved to inpatient because: Inpatient level of care appropriate due to severity of illness  Dispo: The patient is from: Home              Anticipated d/c is to: Home              Patient currently is not medically stable to d/c.   Difficult to place patient No              Level of care: Telemetry Medical  All the records are reviewed and case discussed with Care Management/Social Worker. Management plans discussed with the patient, nursing and they are in agreement.  Consultants:  Neurology Cardiology  Procedures:  Antimicrobials:   Data Reviewed: I have personally reviewed following labs and imaging studies  CBC: Recent Labs  Lab 12/12/20 1748 12/13/20 0559  WBC 10.6* 8.3  NEUTROABS 6.2  --   HGB 14.4 13.2  HCT 43.6 39.4  MCV 87.7 89.3  PLT 317 292   Basic Metabolic Panel: Recent Labs  Lab 12/12/20 1748 12/13/20 0559  NA 138 137  K 3.9 4.0  CL 103 104  CO2 25 26  GLUCOSE 88 109*  BUN 12 10  CREATININE 0.67 0.74  CALCIUM 9.9 9.0   GFR: Estimated Creatinine Clearance: 71.5 mL/min (by C-G formula based on SCr of 0.74 mg/dL). Liver Function Tests: Recent Labs  Lab 12/13/20 0559  AST 28  ALT 23  ALKPHOS 53  BILITOT 0.9  PROT 7.0  ALBUMIN 3.5   No results for input(s): LIPASE, AMYLASE in the last 168 hours. No results for input(s): AMMONIA in the last 168 hours. Coagulation Profile: No results for input(s): INR, PROTIME in the last 168 hours. Cardiac Enzymes: No results for input(s): CKTOTAL, CKMB, CKMBINDEX, TROPONINI in the last 168 hours. BNP (last 3 results) No results for input(s): PROBNP in the last 8760 hours. HbA1C: Recent Labs    12/13/20 0559  HGBA1C 5.7*   CBG: No results for input(s): GLUCAP in the last 168 hours. Lipid Profile: Recent Labs    12/13/20 0559  CHOL 227*  HDL 42  LDLCALC 161*  TRIG 122  CHOLHDL 5.4   Thyroid  Function Tests: No results for input(s): TSH, T4TOTAL, FREET4, T3FREE, THYROIDAB in the last 72 hours. Anemia Panel: No results for input(s): VITAMINB12, FOLATE, FERRITIN, TIBC, IRON, RETICCTPCT in the last 72 hours. Sepsis Labs: No results for input(s): PROCALCITON, LATICACIDVEN in the last 168 hours.  Recent Results (from the past 240 hour(s))  Resp Panel by RT-PCR (Flu A&B, Covid) Nasopharyngeal Swab     Status: None   Collection Time: 12/12/20  5:48 PM   Specimen: Nasopharyngeal Swab; Nasopharyngeal(NP) swabs in vial transport medium  Result Value Ref Range Status   SARS Coronavirus 2 by RT PCR NEGATIVE NEGATIVE Final    Comment: (NOTE) SARS-CoV-2 target nucleic acids are NOT DETECTED.  The SARS-CoV-2 RNA is generally detectable in upper respiratory specimens during the acute phase of infection. The lowest concentration of SARS-CoV-2 viral copies this assay can detect is 138 copies/mL. A negative result does not preclude SARS-Cov-2 infection and should  not be used as the sole basis for treatment or other patient management decisions. A negative result may occur with  improper specimen collection/handling, submission of specimen other than nasopharyngeal swab, presence of viral mutation(s) within the areas targeted by this assay, and inadequate number of viral copies(<138 copies/mL). A negative result must be combined with clinical observations, patient history, and epidemiological information. The expected result is Negative.  Fact Sheet for Patients:  BloggerCourse.com  Fact Sheet for Healthcare Providers:  SeriousBroker.it  This test is no t yet approved or cleared by the [place] FDA and  has been authorized for detection and/or diagnosis of SARS-CoV-2 by FDA under an Emergency Use Authorization (EUA). This EUA will remain  in effect (meaning this test can be used) for the duration of the COVID-19 declaration under  Section 564(b)(1) of the Act, 21 U.S.C.section 360bbb-3(b)(1), unless the authorization is terminated  or revoked sooner.       Influenza A by PCR NEGATIVE NEGATIVE Final   Influenza B by PCR NEGATIVE NEGATIVE Final    Comment: (NOTE) The Xpert Xpress SARS-CoV-2/FLU/RSV plus assay is intended as an aid in the diagnosis of influenza from Nasopharyngeal swab specimens and should not be used as a sole basis for treatment. Nasal washings and aspirates are unacceptable for Xpert Xpress SARS-CoV-2/FLU/RSV testing.  Fact Sheet for Patients: BloggerCourse.com  Fact Sheet for Healthcare Providers: SeriousBroker.it  This test is not yet approved or cleared by the [place] FDA and has been authorized for detection and/or diagnosis of SARS-CoV-2 by FDA under an Emergency Use Authorization (EUA). This EUA will remain in effect (meaning this test can be used) for the duration of the COVID-19 declaration under Section 564(b)(1) of the Act, 21 U.S.C. section 360bbb-3(b)(1), unless the authorization is terminated or revoked.  Performed at [facility], [address], [place], [place] [address]      Radiology Studies: MR ANGIO HEAD WO CONTRAST  Result Date: 12/13/2020 CLINICAL DATA:  Neuro deficit, acute, stroke suspected EXAM: MRA HEAD WITHOUT CONTRAST TECHNIQUE: Angiographic images of the Circle of Willis were acquired using MRA technique without intravenous contrast. COMPARISON:  Concurrently performed brain MRI 12/13/2020. FINDINGS: Anterior circulation: The intracranial internal carotid arteries are patent. The M1 middle cerebral arteries are patent. No M2 proximal branch occlusion or high-grade proximal stenosis is identified. Non dominant left anterior cerebral artery. Apparent flow gap within the proximal A1 segment of the left anterior cerebral artery, which may reflect a severe near-occlusive stenosis or an  occlusion with distal reconstitution via the anterior communicating artery. The anterior cerebral arteries are imaged to the proximal A5 segment level. The anterior cerebral arteries are otherwise patent to this level. Posterior circulation: The intracranial vertebral arteries are patent. Severe stenosis of the proximal V4 left vertebral artery versus artifact. The dominant right vertebral artery is patent intracranially without stenosis. The basilar artery is patent. The posterior cerebral arteries are patent. A left posterior communicating artery is present. The right posterior communicating artery is hypoplastic or absent. Anatomic variants: As described. Impression #1 will be called to the ordering clinician or representative by the Radiologist Assistant, and communication documented in the PACS or [employer]. IMPRESSION: Apparent flow gap within the proximal A1 segment of the left anterior cerebral artery, which may reflect a severe near-occlusive stenosis or an occlusion with distal reconstitution via the anterior communicating artery. 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 Electronically Signed   By: Jackey Loge D.O.  On: 12/13/2020 12:13   MR ANGIO NECK W WO CONTRAST  Result Date: 12/13/2020 CLINICAL DATA:  Neuro deficit, acute, stroke suspected. EXAM: MRA NECK WITHOUT AND WITH CONTRAST TECHNIQUE: Multiplanar and multiecho pulse sequences of the neck were obtained without and with intravenous contrast. Angiographic images of the neck were obtained using MRA technique without and with intravenous contrast. CONTRAST:  8.37mL GADAVIST GADOBUTROL 1 MMOL/ML IV SOLN COMPARISON:  No pertinent prior exams available for comparison. FINDINGS: The examination is intermittently motion degraded, limiting evaluation. Common origin of the innominate and left common carotid arteries. The common carotid and internal carotid arteries are patent within the neck  without hemodynamically significant stenosis (50% or greater). Mild atherosclerotic irregularity about the carotid bifurcations bilaterally. The vertebral arteries are patent within the neck. The right vertebral artery is dominant. Multiple sites of severe stenosis within the V1 and V2 left vertebral artery. IMPRESSION: Motion degraded examination. The common and internal carotid arteries are patent within the neck bilaterally without hemodynamically significant stenosis. Mild atherosclerotic irregularity about the carotid bifurcations, bilaterally. Vertebral arteries patent within the neck. The right vertebral artery is dominant. Multiple sites of severe stenosis within the non dominant V1 and V2 left vertebral artery. Electronically Signed   By: Jackey Loge D.O.   On: 12/13/2020 12:03   MR BRAIN WO CONTRAST  Result Date: 12/13/2020 CLINICAL DATA:  Neuro deficit, acute, stroke suspected. EXAM: MRI HEAD WITHOUT CONTRAST TECHNIQUE: Multiplanar, multiecho pulse sequences of the brain and surrounding structures were obtained without intravenous contrast. COMPARISON:  Head CT 12/12/2020. FINDINGS: Brain: Cerebral volume is normal. There are patchy small acute cortical/subcortical infarcts within the medial posterior left frontal lobe and medial left parietal lobe (left ACA vascular territory). Chronic small-vessel infarcts within the right corona radiata and left basal ganglia. Background mild multifocal T2/FLAIR hyperintensity within the cerebral white matter, nonspecific but compatible with chronic small vessel ischemic disease. No evidence of an intracranial mass. No chronic intracranial blood products. No extra-axial fluid collection. No midline shift. Vascular: Maintained flow voids within the proximal large arterial vessels. Skull and upper cervical spine: No focal suspicious marrow lesion. Sinuses/Orbits: Visualized orbits show no acute finding. Trace mucosal thickening within the bilateral ethmoid and right  maxillary sinuses. IMPRESSION: Patchy small acute cortical/subcortical infarcts within the medial posterior left frontal lobe and medial left parietal lobe (left ACA vascular territory). Chronic small-vessel infarcts within the right corona radiata and left basal ganglia. Background mild chronic small-vessel ischemic changes within the cerebral white matter. Electronically Signed   By: Jackey Loge D.O.   On: 12/13/2020 11:58   MR BRAIN W CONTRAST  Result Date: 12/14/2020 CLINICAL DATA:  Demyelinating disease. Evaluate demyelinating disease. EXAM: MRI HEAD WITH CONTRAST Sagittal T1 weighted postcontrast sequence, sagittal T2 weighted postcontrast sequence, axial T1 weighted postcontrast sequence, coronal TECHNIQUE: Multiplanar, multiecho pulse sequences of the brain and surrounding structures were obtained with intravenous contrast. CONTRAST:  8mL GADAVIST GADOBUTROL 1 MMOL/ML IV SOLN COMPARISON:  Brain MRI 12/13/2020. FINDINGS: A contrast-enhanced brain MRI was performed as a follow-up to the recent noncontrast brain MRI of 12/13/2020. The following sequences were acquired on today's exam: Axial, coronal and sagittal T1 weighted postcontrast sequences, sagittal T2 weighted postcontrast sequence. There is patchy cortically based enhancement within the medial mid-to-posterior left frontal lobe(ACA vascular territory). This is likely secondary to the presence of recent infarcts at these sites. Additional punctate focus of abnormal enhancement within the left temporoparietal junction (series 11, image 31) (series 12, image 10) (series 13, image  19). IMPRESSION: Patchy cortically-based enhancement within the medial mid-to-posterior left frontal lobe (ACA vascular territory). This is likely secondary to the presence of recent infarcts at these sites. There is an additional punctate focus of abnormal enhancement within the left temporoparietal junction. This may also reflect a recent cortical infarct. However, short  interval 4-week contrast-enhanced brain MRI follow-up is recommended to ensure unremarkable evolution of these findings and to exclude alternative etiologies (such as primary CNS neoplasm or intracranial metastatic disease). Electronically Signed   By: Jackey Loge D.O.   On: 12/14/2020 13:29   MR CERVICAL SPINE WO CONTRAST  Result Date: 12/13/2020 CLINICAL DATA:  Cervical radiculopathy EXAM: MRI CERVICAL SPINE WITHOUT CONTRAST TECHNIQUE: Multiplanar, multisequence MR imaging of the cervical spine was performed. No intravenous contrast was administered. COMPARISON:  None. FINDINGS: Technical Note: Despite efforts by the technologist and patient, motion artifact is present on today's exam and could not be eliminated. This reduces exam sensitivity and specificity. Alignment: Straightening of the cervical lordosis without significant listhesis. Vertebrae: No fracture, evidence of discitis, or bone lesion. The C2 and C3 levels are partially fused. Cord: No definite cord signal abnormality is evident within the limitations of this motion degraded exam. Posterior Fossa, vertebral arteries, paraspinal tissues: Please see dedicated concurrent MRI of the brain for evaluation of the posterior fossa structures. No significant abnormality is evident within the paravertebral soft tissues. Disc levels: C2-C3: No significant disc protrusion, foraminal stenosis, or canal stenosis. C3-C4: Shallow left paracentral disc protrusion. Mild left facet arthropathy. Borderline-mild canal stenosis. No significant foraminal stenosis. C4-C5: No definite disc protrusion. Bilateral facet arthropathy. No significant foraminal or canal stenosis. C5-C6: No definite disc protrusion. Mild facet arthropathy. No foraminal or canal stenosis. C6-C7: Shallow central disc protrusion. Mild bilateral facet arthropathy. No foraminal or canal stenosis. C7-T1: No significant disc protrusion, foraminal stenosis, or canal stenosis. IMPRESSION: 1. Motion  degraded exam. 2. Mild degenerative changes of the cervical spine with borderline-mild canal stenosis at C3-C4. No appreciable foraminal stenosis at any level. Electronically Signed   By: Duanne Guess D.O.   On: 12/13/2020 12:00   MR CERVICAL SPINE W CONTRAST  Result Date: 12/14/2020 CLINICAL DATA:  Demyelinating disease. EXAM: MRI CERVICAL SPINE WITH CONTRAST TECHNIQUE: Multiplanar, multisequence MR imaging of the cervical spine was performed following the administration of intravenous contrast. COMPARISON:  MRI of the cervical spine 12/13/2020. FINDINGS: A contrast-enhanced MRI of the cervical spine was performed as a follow-up to the recent prior noncontrast cervical spine MRI of 12/13/2020. The following sequences were acquired on today's exam: Sagittal T2 TSE sequence, axial T1 weighted precontrast sequence, sagittal T1 weighted postcontrast sequence, axial T1 weighted postcontrast sequence. No enhancing lesions are identified within the cervical spinal cord. IMPRESSION: No enhancing lesions are identified within the cervical spinal cord. Electronically Signed   By: Jackey Loge D.O.   On: 12/14/2020 13:32   VAS [place] TRANSCRANIAL DOPPLER W BUBBLES  Result Date: 12/14/2020  Transcranial Doppler with Bubble Patient Name:  Mary Fritz  Date of Exam:   12/14/2020 Medical Rec #: [id number]         Accession #:    [id number] Date of Birth: 1961-03-12        Patient Gender: F Patient Age:   62 years Exam Location:  [facility] Procedure:      VAS [place] TRANSCRANIAL DOPPLER W BUBBLES Referring Phys: Arnetha Courser --------------------------------------------------------------------------------  Indications: Stroke. Comparison Study: No prior studies. Performing Technologist: Jean Rosenthal RDMS, RVT  Examination Guidelines: A complete  evaluation includes B-mode imaging, spectral Doppler, color Doppler, and power Doppler as needed of all accessible portions of each vessel. Bilateral testing is considered  an integral part of a complete examination. Limited examinations for reoccurring indications may be performed as noted.  Summary:  A vascular evaluation was performed. The left middle cerebral artery was studied. An IV was inserted into the patient's left forearm. Verbal informed consent was obtained.  Less than 10 high intensity transient signals (HITS) were heard at rest and with valsalva, indicating a spencer grade 1 patent foramen ovale (PFO). *See table(s) above for TCD measurements and observations.    Preliminary    ECHOCARDIOGRAM COMPLETE  Result Date: 12/14/2020    ECHOCARDIOGRAM REPORT   Patient Name:   Mary Fritz Date of Exam: 12/14/2020 Medical Rec #:  [id number]        Height:       60.0 in Accession #:    [id number]       Weight:       187.6 lb Date of Birth:  [birth year]       BSA:          1.817 m Patient Age:    59 years         BP:           170/79 mmHg Patient Gender: F                HR:           74 bpm. Exam Location:  Inpatient Procedure: 2D Echo, Cardiac Doppler and Color Doppler Indications:    Stroke  History:        Patient has prior history of Echocardiogram examinations and                 Patient has no prior history of Echocardiogram examinations.                 Stroke.  Sonographer:    Sheralyn Boatman RDCS Referring Phys: [id number] [facility]  Sonographer Comments: Technically difficult study due to poor echo windows. IMPRESSIONS  1. Left ventricular ejection fraction, by estimation, is 60 to 65%. The left ventricle has normal function. The left ventricle has no regional wall motion abnormalities. Left ventricular diastolic parameters are consistent with Grade I diastolic dysfunction (impaired relaxation).  2. Right ventricular systolic function is normal. The right ventricular size is normal. There is normal pulmonary artery systolic pressure.  3. The mitral valve is normal in structure. Trivial mitral valve regurgitation. No evidence of mitral stenosis.  4. The aortic valve is  tricuspid. There is mild calcification of the aortic valve. Aortic valve regurgitation is trivial. Mild aortic valve sclerosis is present, with no evidence of aortic valve stenosis.  5. The inferior vena cava is normal in size with greater than 50% respiratory variability, suggesting right atrial pressure of 3 mmHg. FINDINGS  Left Ventricle: Left ventricular ejection fraction, by estimation, is 60 to 65%. The left ventricle has normal function. The left ventricle has no regional wall motion abnormalities. The left ventricular internal cavity size was normal in size. There is  no left ventricular hypertrophy. Left ventricular diastolic parameters are consistent with Grade I diastolic dysfunction (impaired relaxation). Right Ventricle: The right ventricular size is normal. No increase in right ventricular wall thickness. Right ventricular systolic function is normal. There is normal pulmonary artery systolic pressure. The tricuspid regurgitant velocity is 2.29 m/s, and  with an assumed right atrial pressure of 3 mmHg, the  estimated right ventricular systolic pressure is 24.0 mmHg. Left Atrium: Left atrial size was normal in size. Right Atrium: Right atrial size was normal in size. Pericardium: There is no evidence of pericardial effusion. Mitral Valve: The mitral valve is normal in structure. Trivial mitral valve regurgitation. No evidence of mitral valve stenosis. Tricuspid Valve: The tricuspid valve is normal in structure. Tricuspid valve regurgitation is trivial. No evidence of tricuspid stenosis. Aortic Valve: The aortic valve is tricuspid. There is mild calcification of the aortic valve. Aortic valve regurgitation is trivial. Mild aortic valve sclerosis is present, with no evidence of aortic valve stenosis. Pulmonic Valve: The pulmonic valve was normal in structure. Pulmonic valve regurgitation is mild. No evidence of pulmonic stenosis. Aorta: The aortic root is normal in size and structure. Venous: The inferior  vena cava is normal in size with greater than 50% respiratory variability, suggesting right atrial pressure of 3 mmHg. IAS/Shunts: No atrial level shunt detected by color flow Doppler.  LEFT VENTRICLE PLAX 2D LVIDd:         3.60 cm     Diastology LVIDs:         2.00 cm     LV e' medial:    5.55 cm/s LV PW:         1.10 cm     LV E/e' medial:  12.8 LV IVS:        1.10 cm     LV e' lateral:   8.49 cm/s LVOT diam:     1.90 cm     LV E/e' lateral: 8.4 LV SV:         69 LV SV Index:   38 LVOT Area:     2.84 cm  LV Volumes (MOD) LV vol d, MOD A2C: 38.5 ml LV vol d, MOD A4C: 43.1 ml LV vol s, MOD A2C: 13.7 ml LV vol s, MOD A4C: 17.6 ml LV SV MOD A2C:     24.8 ml LV SV MOD A4C:     43.1 ml LV SV MOD BP:      25.1 ml RIGHT VENTRICLE             IVC RV S prime:     13.80 cm/s  IVC diam: 1.60 cm TAPSE (M-mode): 2.0 cm LEFT ATRIUM             Index       RIGHT ATRIUM           Index LA diam:        3.10 cm 1.71 cm/m  RA Area:     11.20 cm LA Vol (A2C):   51.6 ml 28.41 ml/m RA Volume:   29.60 ml  16.29 ml/m LA Vol (A4C):   17.6 ml 9.69 ml/m LA Biplane Vol: 32.0 ml 17.62 ml/m  AORTIC VALVE             PULMONIC VALVE LVOT Vmax:   117.00 cm/s PR End Diast Vel: 1.11 msec LVOT Vmean:  75.900 cm/s LVOT VTI:    0.242 m  AORTA Ao Root diam: 3.50 cm Ao Asc diam:  3.30 cm MITRAL VALVE                TRICUSPID VALVE MV Area (PHT): 3.12 cm     TR Peak grad:   21.0 mmHg MV Decel Time: 243 msec     TR Vmax:        229.00 cm/s MV E velocity: 71.10 cm/s MV A velocity: 109.00 cm/s  SHUNTS MV E/A ratio:  0.65         Systemic VTI:  0.24 m                             Systemic Diam: 1.90 cm Charlton Haws MD Electronically signed by Charlton Haws MD Signature Date/Time: 12/14/2020/4:28:51 PM    Final     Scheduled Meds:  aspirin EC  81 mg Oral Daily   atorvastatin  80 mg Oral Daily   clopidogrel  75 mg Oral Daily   enoxaparin (LOVENOX) injection  40 mg Subcutaneous Q24H   Continuous Infusions:   LOS: 0 days   Time spent: 35  minutes. More than 50% of the time was spent in counseling/coordination of care  Arnetha Courser, MD [facility]  If 7PM-7AM, please contact night-coverage [email or web address]  12/14/2020, 5:36 PM   This record has been created using Dragon voice recognition software. Errors have been sought and corrected,but may not always be located. Such creation errors do not reflect on the standard of care.

## 2020-12-14 NOTE — Progress Notes (Signed)
  Echocardiogram 2D Echocardiogram has been performed.  Janalyn Harder 12/14/2020, 4:26 PM

## 2020-12-14 NOTE — Progress Notes (Addendum)
STROKE TEAM PROGRESS NOTE   INTERVAL HISTORY Her son is at the bedside.  Spanish interpreter services used for interview and exam. Pt states she feels back to baseline now, no further symptoms. Stroke work up underway, pending Echo and TCD, explained that loop may be needed for longer evaluation for Afib.  MRI scan of the brain shows patchy left anterior cerebral artery infarct.  MR angiogram of the brain and neck show left A1 segment stenosis.  Echocardiogram is pending urine drug screen positive for barbiturates.  LDL cholesterol is elevated 161 mg percent.  Medical A1c 5.7. Vitals:   12/13/20 2012 12/13/20 2336 12/14/20 0344 12/14/20 0738  BP: 135/67 125/80 111/66 (!) 137/58  Pulse: 73 69 62 64  Resp: 17 15 17 18   Temp: 98.3 F (36.8 C) 97.9 F (36.6 C) 98.2 F (36.8 C) 97.9 F (36.6 C)  TempSrc: Oral Oral Oral Oral  SpO2: 97% 97% 99% 98%  Weight:      Height:       CBC:  Recent Labs  Lab 12/12/20 1748 12/13/20 0559  WBC 10.6* 8.3  NEUTROABS 6.2  --   HGB 14.4 13.2  HCT 43.6 39.4  MCV 87.7 89.3  PLT 317 292   Basic Metabolic Panel:  Recent Labs  Lab 12/12/20 1748 12/13/20 0559  NA 138 137  K 3.9 4.0  CL 103 104  CO2 25 26  GLUCOSE 88 109*  BUN 12 10  CREATININE 0.67 0.74  CALCIUM 9.9 9.0    Lipid Panel:  Recent Labs  Lab 12/13/20 0559  CHOL 227*  TRIG 122  HDL 42  CHOLHDL 5.4  VLDL 24  LDLCALC 12/15/20*    HgbA1c:  Recent Labs  Lab 12/13/20 0559  HGBA1C 5.7*   Urine Drug Screen:  Recent Labs  Lab 12/13/20 1545  LABOPIA NONE DETECTED  COCAINSCRNUR NONE DETECTED  LABBENZ NONE DETECTED  AMPHETMU NONE DETECTED  THCU NONE DETECTED  LABBARB POSITIVE*    Alcohol Level No results for input(s): ETH in the last 168 hours.  IMAGING past 24 hours MR ANGIO HEAD WO CONTRAST  Result Date: 12/13/2020 CLINICAL DATA:  Neuro deficit, acute, stroke suspected EXAM: MRA HEAD WITHOUT CONTRAST TECHNIQUE: Angiographic images of the Circle of Willis were acquired  using MRA technique without intravenous contrast. COMPARISON:  Concurrently performed brain MRI 12/13/2020. FINDINGS: Anterior circulation: The intracranial internal carotid arteries are patent. The M1 middle cerebral arteries are patent. No M2 proximal branch occlusion or high-grade proximal stenosis is identified. Non dominant left anterior cerebral artery. Apparent flow gap within the proximal A1 segment of the left anterior cerebral artery, which may reflect a severe near-occlusive stenosis or an occlusion with distal reconstitution via the anterior communicating artery. The anterior cerebral arteries are imaged to the proximal A5 segment level. The anterior cerebral arteries are otherwise patent to this level. Posterior circulation: The intracranial vertebral arteries are patent. Severe stenosis of the proximal V4 left vertebral artery versus artifact. The dominant right vertebral artery is patent intracranially without stenosis. The basilar artery is patent. The posterior cerebral arteries are patent. A left posterior communicating artery is present. The right posterior communicating artery is hypoplastic or absent. Anatomic variants: As described. Impression #1 will be called to the ordering clinician or representative by the Radiologist Assistant, and communication documented in the PACS or 12/15/2020. IMPRESSION: Apparent flow gap within the proximal A1 segment of the left anterior cerebral artery, which may reflect a severe near-occlusive stenosis or an occlusion with  distal reconstitution via the anterior communicating artery. 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 Electronically Signed   By: Jackey Loge D.O.   On: 12/13/2020 12:13   MR ANGIO NECK W WO CONTRAST  Result Date: 12/13/2020 CLINICAL DATA:  Neuro deficit, acute, stroke suspected. EXAM: MRA NECK WITHOUT AND WITH CONTRAST TECHNIQUE: Multiplanar and multiecho pulse sequences of the neck  were obtained without and with intravenous contrast. Angiographic images of the neck were obtained using MRA technique without and with intravenous contrast. CONTRAST:  8.55mL GADAVIST GADOBUTROL 1 MMOL/ML IV SOLN COMPARISON:  No pertinent prior exams available for comparison. FINDINGS: The examination is intermittently motion degraded, limiting evaluation. Common origin of the innominate and left common carotid arteries. The common carotid and internal carotid arteries are patent within the neck without hemodynamically significant stenosis (50% or greater). Mild atherosclerotic irregularity about the carotid bifurcations bilaterally. The vertebral arteries are patent within the neck. The right vertebral artery is dominant. Multiple sites of severe stenosis within the V1 and V2 left vertebral artery. IMPRESSION: Motion degraded examination. The common and internal carotid arteries are patent within the neck bilaterally without hemodynamically significant stenosis. Mild atherosclerotic irregularity about the carotid bifurcations, bilaterally. Vertebral arteries patent within the neck. The right vertebral artery is dominant. Multiple sites of severe stenosis within the non dominant V1 and V2 left vertebral artery. Electronically Signed   By: Jackey Loge D.O.   On: 12/13/2020 12:03   MR BRAIN WO CONTRAST  Result Date: 12/13/2020 CLINICAL DATA:  Neuro deficit, acute, stroke suspected. EXAM: MRI HEAD WITHOUT CONTRAST TECHNIQUE: Multiplanar, multiecho pulse sequences of the brain and surrounding structures were obtained without intravenous contrast. COMPARISON:  Head CT 12/12/2020. FINDINGS: Brain: Cerebral volume is normal. There are patchy small acute cortical/subcortical infarcts within the medial posterior left frontal lobe and medial left parietal lobe (left ACA vascular territory). Chronic small-vessel infarcts within the right corona radiata and left basal ganglia. Background mild multifocal T2/FLAIR  hyperintensity within the cerebral white matter, nonspecific but compatible with chronic small vessel ischemic disease. No evidence of an intracranial mass. No chronic intracranial blood products. No extra-axial fluid collection. No midline shift. Vascular: Maintained flow voids within the proximal large arterial vessels. Skull and upper cervical spine: No focal suspicious marrow lesion. Sinuses/Orbits: Visualized orbits show no acute finding. Trace mucosal thickening within the bilateral ethmoid and right maxillary sinuses. IMPRESSION: Patchy small acute cortical/subcortical infarcts within the medial posterior left frontal lobe and medial left parietal lobe (left ACA vascular territory). Chronic small-vessel infarcts within the right corona radiata and left basal ganglia. Background mild chronic small-vessel ischemic changes within the cerebral white matter. Electronically Signed   By: Jackey Loge D.O.   On: 12/13/2020 11:58   MR CERVICAL SPINE WO CONTRAST  Result Date: 12/13/2020 CLINICAL DATA:  Cervical radiculopathy EXAM: MRI CERVICAL SPINE WITHOUT CONTRAST TECHNIQUE: Multiplanar, multisequence MR imaging of the cervical spine was performed. No intravenous contrast was administered. COMPARISON:  None. FINDINGS: Technical Note: Despite efforts by the technologist and patient, motion artifact is present on today's exam and could not be eliminated. This reduces exam sensitivity and specificity. Alignment: Straightening of the cervical lordosis without significant listhesis. Vertebrae: No fracture, evidence of discitis, or bone lesion. The C2 and C3 levels are partially fused. Cord: No definite cord signal abnormality is evident within the limitations of this motion degraded exam. Posterior Fossa, vertebral arteries, paraspinal tissues: Please see dedicated concurrent MRI of the brain for  evaluation of the posterior fossa structures. No significant abnormality is evident within the paravertebral soft tissues.  Disc levels: C2-C3: No significant disc protrusion, foraminal stenosis, or canal stenosis. C3-C4: Shallow left paracentral disc protrusion. Mild left facet arthropathy. Borderline-mild canal stenosis. No significant foraminal stenosis. C4-C5: No definite disc protrusion. Bilateral facet arthropathy. No significant foraminal or canal stenosis. C5-C6: No definite disc protrusion. Mild facet arthropathy. No foraminal or canal stenosis. C6-C7: Shallow central disc protrusion. Mild bilateral facet arthropathy. No foraminal or canal stenosis. C7-T1: No significant disc protrusion, foraminal stenosis, or canal stenosis. IMPRESSION: 1. Motion degraded exam. 2. Mild degenerative changes of the cervical spine with borderline-mild canal stenosis at C3-C4. No appreciable foraminal stenosis at any level. Electronically Signed   By: Duanne Guess D.O.   On: 12/13/2020 12:00    PHYSICAL EXAM  Pleasant middle-aged Hispanic lady overweight; no distress. Psych: Affect appropriate to situation Eyes: No scleral injection HENT: No OP obstrucion Head: Normocephalic.  Cardiovascular: Normal rate and regular rhythm.  Respiratory: Effort normal and breath sounds normal to anterior ascultation GI: Soft.  No distension. There is no tenderness.  Skin: WDI    Neurological Examination Mental Status: Alert, oriented, thought content appropriate.  Speech fluent without evidence of aphasia. Able to follow 3 step commands without difficulty. Cranial Nerves: II: Visual fields grossly normal,  III,IV, VI: ptosis not present, extra-ocular motions intact bilaterally, pupils equal, round, reactive to light and accommodation V,VII: smile symmetric, facial light touch sensation normal bilaterally VIII: hearing normal bilaterally IX,X: uvula rises symmetrically XI: bilateral shoulder shrug XII: midline tongue extension Motor: Right : Upper extremity   5/5    Left:     Upper extremity   5/5  Lower extremity   5/5     Lower  extremity   5/5 Tone and bulk:normal tone throughout; no atrophy noted Sensory: Pinprick and light touch intact throughout, bilaterally Deep Tendon Reflexes: 2+ and symmetric throughout Plantars: Right: downgoing   Left: downgoing Cerebellar: normal finger-to-nose, normal rapid alternating movements and normal heel-to-shin test Gait: normal gait and station   ASSESSMENT/PLAN Ms. Mary Fritz is a 60 y.o. female with with transient numbness and weakness in the right side which has resolved except for mild decreased sensation to cold on right and subjective numbness on the right mid-shin to toes. MRI brain and Cspine are abnormal and will check MRI w/contrast to better evaluate for secondary process such as MS.   Stroke:  left patchy ACA infarct embolic appearing. Cryptogenic at this time, if TTE/TCD w/bubble neg, may need loop placed.  MRI  patchy Left ACA stroke, chronic small vessel disease and old lacunar infarct. Also there are multiple periventricular lesions.  MRI Cspine neg, but possible demyelination lesion seen at C3-4 MRA  no extracranial stenosis. There is Left A1 near occlusion vs severe stenosis. Possible Lt V4 stenosis.  2D Echo pending TCD w/bubble pending LDL 161 HgbA1c 5.7 VTE prophylaxis - lovenox    Diet   Diet Heart Room service appropriate? Yes; Fluid consistency: Thin   clopidogrel 75 mg daily prior to admission, now on aspirin 81 mg daily and clopidogrel 75 mg daily. Continue DAPT x 3 weeks and back to Plavix alone, unless Afib detected, then recommend Eliquis. Therapy recommendations:  pending Disposition:  pending  Hypertension Home meds:  none Stable Permissive hypertension (OK if < 220/120) but gradually normalize in 5-7 days Long-term BP goal normotensive  Hyperlipidemia Home meds:  none LDL 161, goal < 70 Add Lipitor 80mg  po  daily  High intensity statin  Continue statin at discharge  Diabetes type II: no dx HgbA1c 5.7, goal < 7.0 CBGs No  results for input(s): GLUCAP in the last 72 hours.  SSI  Other Stroke Risk Factors Obesity, Body mass index is 36.64 kg/m., BMI >/= 30 associated with increased stroke risk, recommend weight loss, diet and exercise as appropriate  Hx stroke/TIA  Hospital day # 0  Desiree Metzger-Cihelka, ARNP-C, ANVP-BC Pager: [phone]   STROKE MD NOTE : I have personally obtained history,examined this patient, reviewed notes, independently viewed imaging studies, participated in medical decision making and plan of care.ROS completed by me personally and pertinent positives fully documented  I have made any additions or clarifications directly to the above note. Agree with note above.  She presented with left ACA branch embolic infarct with right-sided weakness which appears to have improved.  Recommend ongoing stroke work-up by checking echocardiogram, TCD bubble study for PFO and TEE and loop recorder.  Check MRI scan of the brain and cervical spine with contrast to rule out any enhancing or demyelinating lesions.  Aspirin and Plavix for 3 weeks followed by aspirin alone and aggressive risk factor modification.  Add statin and tighter control of diabetes.  Long discussion with patient and her husband using Spanish language interpreter and answered questions.  Discussed with Dr. Nelson Chimes.  Greater than 50% time during this 35-minute visit was spent on counseling and coordination of care about her embolic stroke and answering questions.  Delia Heady, MD Medical Director [facility] Pager: [phone] 12/14/2020 4:30 PM   To contact Stroke Continuity provider, please refer to [email or web address]. After hours, contact General Neurology

## 2020-12-15 DIAGNOSIS — R29701 NIHSS score 1: Secondary | ICD-10-CM | POA: Diagnosis present

## 2020-12-15 DIAGNOSIS — E669 Obesity, unspecified: Secondary | ICD-10-CM | POA: Diagnosis present

## 2020-12-15 DIAGNOSIS — I639 Cerebral infarction, unspecified: Secondary | ICD-10-CM | POA: Diagnosis present

## 2020-12-15 DIAGNOSIS — I480 Paroxysmal atrial fibrillation: Secondary | ICD-10-CM | POA: Diagnosis not present

## 2020-12-15 DIAGNOSIS — E119 Type 2 diabetes mellitus without complications: Secondary | ICD-10-CM | POA: Diagnosis present

## 2020-12-15 DIAGNOSIS — Z7982 Long term (current) use of aspirin: Secondary | ICD-10-CM | POA: Diagnosis not present

## 2020-12-15 DIAGNOSIS — I351 Nonrheumatic aortic (valve) insufficiency: Secondary | ICD-10-CM | POA: Diagnosis present

## 2020-12-15 DIAGNOSIS — I371 Nonrheumatic pulmonary valve insufficiency: Secondary | ICD-10-CM | POA: Diagnosis present

## 2020-12-15 DIAGNOSIS — Z713 Dietary counseling and surveillance: Secondary | ICD-10-CM | POA: Diagnosis not present

## 2020-12-15 DIAGNOSIS — Z8673 Personal history of transient ischemic attack (TIA), and cerebral infarction without residual deficits: Secondary | ICD-10-CM | POA: Diagnosis not present

## 2020-12-15 DIAGNOSIS — Z20822 Contact with and (suspected) exposure to covid-19: Secondary | ICD-10-CM | POA: Diagnosis present

## 2020-12-15 DIAGNOSIS — Z9049 Acquired absence of other specified parts of digestive tract: Secondary | ICD-10-CM | POA: Diagnosis not present

## 2020-12-15 DIAGNOSIS — Q211 Atrial septal defect: Secondary | ICD-10-CM | POA: Diagnosis not present

## 2020-12-15 DIAGNOSIS — I358 Other nonrheumatic aortic valve disorders: Secondary | ICD-10-CM | POA: Diagnosis present

## 2020-12-15 DIAGNOSIS — Z6836 Body mass index (BMI) 36.0-36.9, adult: Secondary | ICD-10-CM | POA: Diagnosis not present

## 2020-12-15 DIAGNOSIS — R609 Edema, unspecified: Secondary | ICD-10-CM | POA: Diagnosis not present

## 2020-12-15 DIAGNOSIS — I10 Essential (primary) hypertension: Secondary | ICD-10-CM | POA: Diagnosis present

## 2020-12-15 DIAGNOSIS — G8191 Hemiplegia, unspecified affecting right dominant side: Secondary | ICD-10-CM | POA: Diagnosis present

## 2020-12-15 DIAGNOSIS — I4891 Unspecified atrial fibrillation: Secondary | ICD-10-CM | POA: Diagnosis present

## 2020-12-15 DIAGNOSIS — Z7902 Long term (current) use of antithrombotics/antiplatelets: Secondary | ICD-10-CM | POA: Diagnosis not present

## 2020-12-15 DIAGNOSIS — I63422 Cerebral infarction due to embolism of left anterior cerebral artery: Secondary | ICD-10-CM | POA: Diagnosis present

## 2020-12-15 DIAGNOSIS — Z800 Family history of malignant neoplasm of digestive organs: Secondary | ICD-10-CM | POA: Diagnosis not present

## 2020-12-15 DIAGNOSIS — G8929 Other chronic pain: Secondary | ICD-10-CM | POA: Diagnosis present

## 2020-12-15 DIAGNOSIS — E785 Hyperlipidemia, unspecified: Secondary | ICD-10-CM | POA: Diagnosis present

## 2020-12-15 NOTE — Progress Notes (Signed)
PROGRESS NOTE    Mary Fritz  XQJ:[id number] DOB: 05-12-1960 DOA: 12/12/2020 PCP: Casper Harrison, Stephanie Coup, MD   Brief Narrative: Taken from prior notes.  Mary Fritz is a 60 y.o. female with prior history of hypertension presently not on any medications after her primary care physician stopped medications 3 months ago started experiencing tingling and numbness of the right upper and lower extremity intermittently over the last 1 week.  Patient symptoms happened a month ago which lasted for few minutes and stopped.  Then the symptoms started again last week happen 4 or 5 times over the period of 5 to 6 days.  The symptoms are largely tingling and numbness of the right upper and lower extremity lasting for 5 minutes during which patient also feels weak and unable to ambulate.  Denies any associated visual symptoms or difficulty speaking or swallowing.  Symptoms resolved completely without any residual symptoms. Patient had gone to her primary care physician who ordered a CT head which showed possibility of nonacute stroke in the right corona radiata and was referred to the ER.   Initial CT head was negative for any acute stroke but did show remote lacunar strokes.  MRI with patchy small acute cortical/Subcortical infarcts within the medial posterior left frontal lobe and medial left parietal lobe, left ACA territory.  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 Echocardiogram with an normal limit but transcranial bubble studies with some concern of grade 1 patent foramen ovale.  Patient  need TEE and or loop recorder.  Neurology is concerned about cryptogenic/embolic  stroke.  TEE scheduled for tomorrow, will also get loop recorder before discharge. Neurology ordered lower extremity venous Doppler to rule out DVT.  Subjective: Patient was seen and examined today.  Sitting comfortably in chair.  No new complaints or deficits.  Son at bedside.  Agreeable to proceed with TEE and a loop recorder. She is a Spanish-speaking lady and an interpreter was used for communication.  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  Neurology is on board.  Basic stroke work-up completed but patient will need TEE and loop recorder before discharge. -TEE scheduled for tomorrow -Neurology ordered lower extremity venous Doppler-pending -She will need loop recorder before discharge-cardiology will arrange -Continue with DAPT for 3 weeks -Continue with statin  Hypertension.  Patient was not on any antihypertensives at home.  Blood pressure within goal. -Permissive hypertension up to 200 systolic per neurology.  Objective: Vitals:   12/15/20 0000 12/15/20 0445 12/15/20 0749 12/15/20 1232  BP: 114/74 119/63 125/74 139/78  Pulse: 72 69 80 84  Resp:  18    Temp: 98.4 F (36.9 C) 98.4 F (36.9 C) 98.4 F (36.9 C) 98.3 F (36.8 C)  TempSrc: Oral Oral Oral Oral  SpO2: 98% 96% 99% 98%  Weight:      Height:       No intake or output data in the 24 hours ending 12/15/20 1538 Filed Weights   12/13/20 0141 12/13/20 0516  Weight: 88.6 kg 85.1 kg    Examination:  General.  Well-developed lady, in no acute distress. Pulmonary.  Lungs  clear bilaterally, normal respiratory effort. CV.  Regular rate and rhythm, no JVD, rub or murmur. Abdomen.  Soft, nontender, nondistended, BS positive. CNS.  Alert and oriented x3.  No focal neurologic deficit. Extremities.  No edema, no cyanosis, pulses intact and symmetrical. Psychiatry.  Judgment and insight appears normal.   DVT  prophylaxis: Lovenox Code Status: Full Family Communication: Discussed with son at bedside Disposition Plan:  Status is: Observation  The patient will require care spanning > 2 midnights and should be moved to inpatient because: Inpatient level of care appropriate due to severity of illness  Dispo: The patient is from: Home              Anticipated d/c is to: Home              Patient currently is not medically stable to d/c.   Difficult to place patient No              Level of care: Med-Surg  All the records are reviewed and case discussed with Care Management/Social Worker. Management plans discussed with the patient, nursing and they are in agreement.  Consultants:  Neurology Cardiology  Procedures:  Antimicrobials:   Data Reviewed: I have personally reviewed following labs and imaging studies  CBC: Recent Labs  Lab 12/12/20 1748 12/13/20 0559  WBC 10.6* 8.3  NEUTROABS 6.2  --   HGB 14.4 13.2  HCT 43.6 39.4  MCV 87.7 89.3  PLT 317 292    Basic Metabolic Panel: Recent Labs  Lab 12/12/20 1748 12/13/20 0559  NA 138 137  K 3.9 4.0  CL 103 104  CO2 25 26  GLUCOSE 88 109*  BUN 12 10  CREATININE 0.67 0.74  CALCIUM 9.9 9.0    GFR: Estimated Creatinine Clearance: 71.5 mL/min (by C-G formula based on SCr of 0.74 mg/dL). Liver Function Tests: Recent Labs  Lab 12/13/20 0559  AST 28  ALT 23  ALKPHOS 53  BILITOT 0.9  PROT 7.0  ALBUMIN 3.5    No results for input(s): LIPASE, AMYLASE in the last 168 hours. No results for input(s): AMMONIA in the last 168 hours. Coagulation Profile: No results for input(s): INR, PROTIME in the last 168 hours. Cardiac Enzymes: No results for input(s): CKTOTAL, CKMB, CKMBINDEX, TROPONINI in the last 168 hours. BNP (last 3 results) No results for input(s): PROBNP in the last 8760 hours. HbA1C: Recent Labs    12/13/20 0559  HGBA1C 5.7*    CBG: No results for input(s): GLUCAP in the last 168 hours. Lipid  Profile: Recent Labs    12/13/20 0559  CHOL 227*  HDL 42  LDLCALC 161*  TRIG 122  CHOLHDL 5.4    Thyroid Function Tests: No results for input(s): TSH, T4TOTAL, FREET4, T3FREE, THYROIDAB in the last 72 hours. Anemia Panel: No results for input(s): VITAMINB12, FOLATE, FERRITIN, TIBC, IRON, RETICCTPCT in the last 72 hours. Sepsis Labs: No results for input(s): PROCALCITON, LATICACIDVEN in the last 168 hours.  Recent Results (from the past 240 hour(s))  Resp Panel by RT-PCR (Flu A&B, Covid) Nasopharyngeal Swab     Status: None   Collection Time: 12/12/20  5:48 PM   Specimen: Nasopharyngeal Swab; Nasopharyngeal(NP) swabs in vial transport medium  Result Value Ref Range Status   SARS Coronavirus 2 by RT PCR NEGATIVE NEGATIVE Final    Comment: (NOTE) SARS-CoV-2 target nucleic acids are NOT DETECTED.  The SARS-CoV-2 RNA is generally detectable in upper respiratory specimens during the acute phase of infection. The  lowest concentration of SARS-CoV-2 viral copies this assay can detect is 138 copies/mL. A negative result does not preclude SARS-Cov-2 infection and should not be used as the sole basis for treatment or other patient management decisions. A negative result may occur with  improper specimen collection/handling, submission of specimen other than nasopharyngeal swab, presence of viral mutation(s) within the areas targeted by this assay, and inadequate number of viral copies(<138 copies/mL). A negative result must be combined with clinical observations, patient history, and epidemiological information. The expected result is Negative.  Fact Sheet for Patients:  BloggerCourse.com  Fact Sheet for Healthcare Providers:  SeriousBroker.it  This test is no t yet approved or cleared by the [place] FDA and  has been authorized for detection and/or diagnosis of SARS-CoV-2 by FDA under an Emergency Use Authorization (EUA).  This EUA will remain  in effect (meaning this test can be used) for the duration of the COVID-19 declaration under Section 564(b)(1) of the Act, 21 U.S.C.section 360bbb-3(b)(1), unless the authorization is terminated  or revoked sooner.       Influenza A by PCR NEGATIVE NEGATIVE Final   Influenza B by PCR NEGATIVE NEGATIVE Final    Comment: (NOTE) The Xpert Xpress SARS-CoV-2/FLU/RSV plus assay is intended as an aid in the diagnosis of influenza from Nasopharyngeal swab specimens and should not be used as a sole basis for treatment. Nasal washings and aspirates are unacceptable for Xpert Xpress SARS-CoV-2/FLU/RSV testing.  Fact Sheet for Patients: BloggerCourse.com  Fact Sheet for Healthcare Providers: SeriousBroker.it  This test is not yet approved or cleared by the [place] FDA and has been authorized for detection and/or diagnosis of SARS-CoV-2 by FDA under an Emergency Use Authorization (EUA). This EUA will remain in effect (meaning this test can be used) for the duration of the COVID-19 declaration under Section 564(b)(1) of the Act, 21 U.S.C. section 360bbb-3(b)(1), unless the authorization is terminated or revoked.  Performed at [facility], [address], [place], [place] [address]       Radiology Studies: MR BRAIN W CONTRAST  Result Date: 12/14/2020 CLINICAL DATA:  Demyelinating disease. Evaluate demyelinating disease. EXAM: MRI HEAD WITH CONTRAST Sagittal T1 weighted postcontrast sequence, sagittal T2 weighted postcontrast sequence, axial T1 weighted postcontrast sequence, coronal TECHNIQUE: Multiplanar, multiecho pulse sequences of the brain and surrounding structures were obtained with intravenous contrast. CONTRAST:  53mL GADAVIST GADOBUTROL 1 MMOL/ML IV SOLN COMPARISON:  Brain MRI 12/13/2020. FINDINGS: A contrast-enhanced brain MRI was performed as a follow-up to the recent noncontrast  brain MRI of 12/13/2020. The following sequences were acquired on today's exam: Axial, coronal and sagittal T1 weighted postcontrast sequences, sagittal T2 weighted postcontrast sequence. There is patchy cortically based enhancement within the medial mid-to-posterior left frontal lobe(ACA vascular territory). This is likely secondary to the presence of recent infarcts at these sites. Additional punctate focus of abnormal enhancement within the left temporoparietal junction (series 11, image 31) (series 12, image 10) (series 13, image 19). IMPRESSION: Patchy cortically-based enhancement within the medial mid-to-posterior left frontal lobe (ACA vascular territory). This is likely secondary to the presence of recent infarcts at these sites. There is an additional punctate focus of abnormal enhancement within the left temporoparietal junction. This may also reflect a recent cortical infarct. However, short interval 4-week contrast-enhanced brain MRI follow-up is recommended to ensure unremarkable evolution of these findings and to exclude alternative etiologies (such as primary CNS neoplasm or intracranial metastatic disease). Electronically Signed   By: Jackey Loge D.O.   On:  12/14/2020 13:29   MR CERVICAL SPINE W CONTRAST  Result Date: 12/14/2020 CLINICAL DATA:  Demyelinating disease. EXAM: MRI CERVICAL SPINE WITH CONTRAST TECHNIQUE: Multiplanar, multisequence MR imaging of the cervical spine was performed following the administration of intravenous contrast. COMPARISON:  MRI of the cervical spine 12/13/2020. FINDINGS: A contrast-enhanced MRI of the cervical spine was performed as a follow-up to the recent prior noncontrast cervical spine MRI of 12/13/2020. The following sequences were acquired on today's exam: Sagittal T2 TSE sequence, axial T1 weighted precontrast sequence, sagittal T1 weighted postcontrast sequence, axial T1 weighted postcontrast sequence. No enhancing lesions are identified within the  cervical spinal cord. IMPRESSION: No enhancing lesions are identified within the cervical spinal cord. Electronically Signed   By: Jackey Loge D.O.   On: 12/14/2020 13:32   VAS [place] TRANSCRANIAL DOPPLER W BUBBLES  Result Date: 12/15/2020  Transcranial Doppler with Bubble Patient Name:  BAYLEA MILBURN  Date of Exam:   12/14/2020 Medical Rec #: [id number]         Accession #:    [id number] Date of Birth: Apr 14, 1960        Patient Gender: F Patient Age:   30 years Exam Location:  [facility] Procedure:      VAS [place] TRANSCRANIAL DOPPLER W BUBBLES Referring Phys: Arnetha Courser --------------------------------------------------------------------------------  Indications: Stroke. Comparison Study: No prior studies. Performing Technologist: Jean Rosenthal RDMS, RVT  Examination Guidelines: A complete evaluation includes B-mode imaging, spectral Doppler, color Doppler, and power Doppler as needed of all accessible portions of each vessel. Bilateral testing is considered an integral part of a complete examination. Limited examinations for reoccurring indications may be performed as noted.  Summary:  A vascular evaluation was performed. The left middle cerebral artery was studied. An IV was inserted into the patient's left forearm. Verbal informed consent was obtained.  Less than 10 high intensity transient signals (HITS) were heard at rest and with valsalva, indicating a spencer grade 1 patent foramen ovale (PFO). Positive TCD Bubble study indicative of a small right to left shunt *See table(s) above for TCD measurements and observations.  Diagnosing physician: Delia Heady MD Electronically signed by Delia Heady MD on 12/15/2020 at 12:45:39 PM.    Final    ECHOCARDIOGRAM COMPLETE  Result Date: 12/14/2020    ECHOCARDIOGRAM REPORT   Patient Name:   KENAE LINDQUIST Date of Exam: 12/14/2020 Medical Rec #:  [id number]        Height:       60.0 in Accession #:    [id number]       Weight:       187.6 lb Date of Birth:   06-Oct-1960       BSA:          1.817 m Patient Age:    59 years         BP:           170/79 mmHg Patient Gender: F                HR:           74 bpm. Exam Location:  Inpatient Procedure: 2D Echo, Cardiac Doppler and Color Doppler Indications:    Stroke  History:        Patient has prior history of Echocardiogram examinations and                 Patient has no prior history of Echocardiogram examinations.  Stroke.  Sonographer:    Tina West RDCS Referring Phys: [id number] Camdyn Beske  Sonographer Comments: Technically difficult study due to poor echo windows. IMPRESSIONS  1. Left ventricular ejection fraction, by estimation, is 60 to 65%. The left ventricle has normal function. The left ventricle has no regional wall motion abnormalities. Left ventricular diastolic parameters are consistent with Grade I diastolic dysfunction (impaired relaxation).  2. Right ventricular systolic function is normal. The right ventricular size is normal. There is normal pulmonary artery systolic pressure.  3. The mitral valve is normal in structure. Trivial mitral valve regurgitation. No evidence of mitral stenosis.  4. The aortic valve is tricuspid. There is mild calcification of the aortic valve. Aortic valve regurgitation is trivial. Mild aortic valve sclerosis is present, with no evidence of aortic valve stenosis.  5. The inferior vena cava is normal in size with greater than 50% respiratory variability, suggesting right atrial pressure of 3 mmHg. FINDINGS  Left Ventricle: Left ventricular ejection fraction, by estimation, is 60 to 65%. The left ventricle has normal function. The left ventricle has no regional wall motion abnormalities. The left ventricular internal cavity size was normal in size. There is  no left ventricular hypertrophy. Left ventricular diastolic parameters are consistent with Grade I diastolic dysfunction (impaired relaxation). Right Ventricle: The right ventricular size is normal. No increase  in right ventricular wall thickness. Right ventricular systolic function is normal. There is normal pulmonary artery systolic pressure. The tricuspid regurgitant velocity is 2.29 m/s, and  with an assumed right atrial pressure of 3 mmHg, the estimated right ventricular systolic pressure is 24.0 mmHg. Left Atrium: Left atrial size was normal in size. Right Atrium: Right atrial size was normal in size. Pericardium: There is no evidence of pericardial effusion. Mitral Valve: The mitral valve is normal in structure. Trivial mitral valve regurgitation. No evidence of mitral valve stenosis. Tricuspid Valve: The tricuspid valve is normal in structure. Tricuspid valve regurgitation is trivial. No evidence of tricuspid stenosis. Aortic Valve: The aortic valve is tricuspid. There is mild calcification of the aortic valve. Aortic valve regurgitation is trivial. Mild aortic valve sclerosis is present, with no evidence of aortic valve stenosis. Pulmonic Valve: The pulmonic valve was normal in structure. Pulmonic valve regurgitation is mild. No evidence of pulmonic stenosis. Aorta: The aortic root is normal in size and structure. Venous: The inferior vena cava is normal in size with greater than 50% respiratory variability, suggesting right atrial pressure of 3 mmHg. IAS/Shunts: No atrial level shunt detected by color flow Doppler.  LEFT VENTRICLE PLAX 2D LVIDd:         3.60 cm     Diastology LVIDs:         2.00 cm     LV e' medial:    5.55 cm/s LV PW:         1.10 cm     LV E/e' medial:  12.8 LV IVS:        1.10 cm     LV e' lateral:   8.49 cm/s LVOT diam:     1.90 cm     LV E/e' lateral: 8.4 LV SV:         69 LV SV Index:   38 LVOT Area:     2.84 cm  LV Volumes (MOD) LV vol d, MOD A2C: 38.5 ml LV vol d, MOD A4C: 43.1 ml LV vol s, MOD A2C: 13.7 ml LV vol s, MOD A4C: 17.6 ml LV SV MOD A2C:       24.8 ml LV SV MOD A4C:     43.1 ml LV SV MOD BP:      25.1 ml RIGHT VENTRICLE             IVC RV S prime:     13.80 cm/s  IVC diam: 1.60  cm TAPSE (M-mode): 2.0 cm LEFT ATRIUM             Index       RIGHT ATRIUM           Index LA diam:        3.10 cm 1.71 cm/m  RA Area:     11.20 cm LA Vol (A2C):   51.6 ml 28.41 ml/m RA Volume:   29.60 ml  16.29 ml/m LA Vol (A4C):   17.6 ml 9.69 ml/m LA Biplane Vol: 32.0 ml 17.62 ml/m  AORTIC VALVE             PULMONIC VALVE LVOT Vmax:   117.00 cm/s PR End Diast Vel: 1.11 msec LVOT Vmean:  75.900 cm/s LVOT VTI:    0.242 m  AORTA Ao Root diam: 3.50 cm Ao Asc diam:  3.30 cm MITRAL VALVE                TRICUSPID VALVE MV Area (PHT): 3.12 cm     TR Peak grad:   21.0 mmHg MV Decel Time: 243 msec     TR Vmax:        229.00 cm/s MV E velocity: 71.10 cm/s MV A velocity: 109.00 cm/s  SHUNTS MV E/A ratio:  0.65         Systemic VTI:  0.24 m                             Systemic Diam: 1.90 cm Charlton Haws MD Electronically signed by Charlton Haws MD Signature Date/Time: 12/14/2020/4:28:51 PM    Final     Scheduled Meds:  aspirin EC  81 mg Oral Daily   atorvastatin  80 mg Oral Daily   clopidogrel  75 mg Oral Daily   enoxaparin (LOVENOX) injection  40 mg Subcutaneous Q24H   Continuous Infusions:   LOS: 0 days   Time spent: 33 minutes. More than 50% of the time was spent in counseling/coordination of care  Arnetha Courser, MD [facility]  If 7PM-7AM, please contact night-coverage [email or web address]  12/15/2020, 3:38 PM   This record has been created using Conservation officer, historic buildings. Errors have been sought and corrected,but may not always be located. Such creation errors do not reflect on the standard of care.

## 2020-12-15 NOTE — H&P (View-Only) (Signed)
PROGRESS NOTE    Mary Fritz  XQJ:[id number] DOB: 05-12-1960 DOA: 12/12/2020 PCP: Casper Harrison, Stephanie Coup, MD   Brief Narrative: Taken from prior notes.  Mary Fritz is a 60 y.o. female with prior history of hypertension presently not on any medications after her primary care physician stopped medications 3 months ago started experiencing tingling and numbness of the right upper and lower extremity intermittently over the last 1 week.  Patient symptoms happened a month ago which lasted for few minutes and stopped.  Then the symptoms started again last week happen 4 or 5 times over the period of 5 to 6 days.  The symptoms are largely tingling and numbness of the right upper and lower extremity lasting for 5 minutes during which patient also feels weak and unable to ambulate.  Denies any associated visual symptoms or difficulty speaking or swallowing.  Symptoms resolved completely without any residual symptoms. Patient had gone to her primary care physician who ordered a CT head which showed possibility of nonacute stroke in the right corona radiata and was referred to the ER.   Initial CT head was negative for any acute stroke but did show remote lacunar strokes.  MRI with patchy small acute cortical/Subcortical infarcts within the medial posterior left frontal lobe and medial left parietal lobe, left ACA territory.  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 Echocardiogram with an normal limit but transcranial bubble studies with some concern of grade 1 patent foramen ovale.  Patient  need TEE and or loop recorder.  Neurology is concerned about cryptogenic/embolic  stroke.  TEE scheduled for tomorrow, will also get loop recorder before discharge. Neurology ordered lower extremity venous Doppler to rule out DVT.  Subjective: Patient was seen and examined today.  Sitting comfortably in chair.  No new complaints or deficits.  Son at bedside.  Agreeable to proceed with TEE and a loop recorder. She is a Spanish-speaking lady and an interpreter was used for communication.  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  Neurology is on board.  Basic stroke work-up completed but patient will need TEE and loop recorder before discharge. -TEE scheduled for tomorrow -Neurology ordered lower extremity venous Doppler-pending -She will need loop recorder before discharge-cardiology will arrange -Continue with DAPT for 3 weeks -Continue with statin  Hypertension.  Patient was not on any antihypertensives at home.  Blood pressure within goal. -Permissive hypertension up to 200 systolic per neurology.  Objective: Vitals:   12/15/20 0000 12/15/20 0445 12/15/20 0749 12/15/20 1232  BP: 114/74 119/63 125/74 139/78  Pulse: 72 69 80 84  Resp:  18    Temp: 98.4 F (36.9 C) 98.4 F (36.9 C) 98.4 F (36.9 C) 98.3 F (36.8 C)  TempSrc: Oral Oral Oral Oral  SpO2: 98% 96% 99% 98%  Weight:      Height:       No intake or output data in the 24 hours ending 12/15/20 1538 Filed Weights   12/13/20 0141 12/13/20 0516  Weight: 88.6 kg 85.1 kg    Examination:  General.  Well-developed lady, in no acute distress. Pulmonary.  Lungs  clear bilaterally, normal respiratory effort. CV.  Regular rate and rhythm, no JVD, rub or murmur. Abdomen.  Soft, nontender, nondistended, BS positive. CNS.  Alert and oriented x3.  No focal neurologic deficit. Extremities.  No edema, no cyanosis, pulses intact and symmetrical. Psychiatry.  Judgment and insight appears normal.   DVT  prophylaxis: Lovenox Code Status: Full Family Communication: Discussed with son at bedside Disposition Plan:  Status is: Observation  The patient will require care spanning > 2 midnights and should be moved to inpatient because: Inpatient level of care appropriate due to severity of illness  Dispo: The patient is from: Home              Anticipated d/c is to: Home              Patient currently is not medically stable to d/c.   Difficult to place patient No              Level of care: Med-Surg  All the records are reviewed and case discussed with Care Management/Social Worker. Management plans discussed with the patient, nursing and they are in agreement.  Consultants:  Neurology Cardiology  Procedures:  Antimicrobials:   Data Reviewed: I have personally reviewed following labs and imaging studies  CBC: Recent Labs  Lab 12/12/20 1748 12/13/20 0559  WBC 10.6* 8.3  NEUTROABS 6.2  --   HGB 14.4 13.2  HCT 43.6 39.4  MCV 87.7 89.3  PLT 317 292    Basic Metabolic Panel: Recent Labs  Lab 12/12/20 1748 12/13/20 0559  NA 138 137  K 3.9 4.0  CL 103 104  CO2 25 26  GLUCOSE 88 109*  BUN 12 10  CREATININE 0.67 0.74  CALCIUM 9.9 9.0    GFR: Estimated Creatinine Clearance: 71.5 mL/min (by C-G formula based on SCr of 0.74 mg/dL). Liver Function Tests: Recent Labs  Lab 12/13/20 0559  AST 28  ALT 23  ALKPHOS 53  BILITOT 0.9  PROT 7.0  ALBUMIN 3.5    No results for input(s): LIPASE, AMYLASE in the last 168 hours. No results for input(s): AMMONIA in the last 168 hours. Coagulation Profile: No results for input(s): INR, PROTIME in the last 168 hours. Cardiac Enzymes: No results for input(s): CKTOTAL, CKMB, CKMBINDEX, TROPONINI in the last 168 hours. BNP (last 3 results) No results for input(s): PROBNP in the last 8760 hours. HbA1C: Recent Labs    12/13/20 0559  HGBA1C 5.7*    CBG: No results for input(s): GLUCAP in the last 168 hours. Lipid  Profile: Recent Labs    12/13/20 0559  CHOL 227*  HDL 42  LDLCALC 161*  TRIG 122  CHOLHDL 5.4    Thyroid Function Tests: No results for input(s): TSH, T4TOTAL, FREET4, T3FREE, THYROIDAB in the last 72 hours. Anemia Panel: No results for input(s): VITAMINB12, FOLATE, FERRITIN, TIBC, IRON, RETICCTPCT in the last 72 hours. Sepsis Labs: No results for input(s): PROCALCITON, LATICACIDVEN in the last 168 hours.  Recent Results (from the past 240 hour(s))  Resp Panel by RT-PCR (Flu A&B, Covid) Nasopharyngeal Swab     Status: None   Collection Time: 12/12/20  5:48 PM   Specimen: Nasopharyngeal Swab; Nasopharyngeal(NP) swabs in vial transport medium  Result Value Ref Range Status   SARS Coronavirus 2 by RT PCR NEGATIVE NEGATIVE Final    Comment: (NOTE) SARS-CoV-2 target nucleic acids are NOT DETECTED.  The SARS-CoV-2 RNA is generally detectable in upper respiratory specimens during the acute phase of infection. The  lowest concentration of SARS-CoV-2 viral copies this assay can detect is 138 copies/mL. A negative result does not preclude SARS-Cov-2 infection and should not be used as the sole basis for treatment or other patient management decisions. A negative result may occur with  improper specimen collection/handling, submission of specimen other than nasopharyngeal swab, presence of viral mutation(s) within the areas targeted by this assay, and inadequate number of viral copies(<138 copies/mL). A negative result must be combined with clinical observations, patient history, and epidemiological information. The expected result is Negative.  Fact Sheet for Patients:  BloggerCourse.com  Fact Sheet for Healthcare Providers:  SeriousBroker.it  This test is no t yet approved or cleared by the [place] FDA and  has been authorized for detection and/or diagnosis of SARS-CoV-2 by FDA under an Emergency Use Authorization (EUA).  This EUA will remain  in effect (meaning this test can be used) for the duration of the COVID-19 declaration under Section 564(b)(1) of the Act, 21 U.S.C.section 360bbb-3(b)(1), unless the authorization is terminated  or revoked sooner.       Influenza A by PCR NEGATIVE NEGATIVE Final   Influenza B by PCR NEGATIVE NEGATIVE Final    Comment: (NOTE) The Xpert Xpress SARS-CoV-2/FLU/RSV plus assay is intended as an aid in the diagnosis of influenza from Nasopharyngeal swab specimens and should not be used as a sole basis for treatment. Nasal washings and aspirates are unacceptable for Xpert Xpress SARS-CoV-2/FLU/RSV testing.  Fact Sheet for Patients: BloggerCourse.com  Fact Sheet for Healthcare Providers: SeriousBroker.it  This test is not yet approved or cleared by the [place] FDA and has been authorized for detection and/or diagnosis of SARS-CoV-2 by FDA under an Emergency Use Authorization (EUA). This EUA will remain in effect (meaning this test can be used) for the duration of the COVID-19 declaration under Section 564(b)(1) of the Act, 21 U.S.C. section 360bbb-3(b)(1), unless the authorization is terminated or revoked.  Performed at [facility], [address], [place], [place] [address]       Radiology Studies: MR BRAIN W CONTRAST  Result Date: 12/14/2020 CLINICAL DATA:  Demyelinating disease. Evaluate demyelinating disease. EXAM: MRI HEAD WITH CONTRAST Sagittal T1 weighted postcontrast sequence, sagittal T2 weighted postcontrast sequence, axial T1 weighted postcontrast sequence, coronal TECHNIQUE: Multiplanar, multiecho pulse sequences of the brain and surrounding structures were obtained with intravenous contrast. CONTRAST:  53mL GADAVIST GADOBUTROL 1 MMOL/ML IV SOLN COMPARISON:  Brain MRI 12/13/2020. FINDINGS: A contrast-enhanced brain MRI was performed as a follow-up to the recent noncontrast  brain MRI of 12/13/2020. The following sequences were acquired on today's exam: Axial, coronal and sagittal T1 weighted postcontrast sequences, sagittal T2 weighted postcontrast sequence. There is patchy cortically based enhancement within the medial mid-to-posterior left frontal lobe(ACA vascular territory). This is likely secondary to the presence of recent infarcts at these sites. Additional punctate focus of abnormal enhancement within the left temporoparietal junction (series 11, image 31) (series 12, image 10) (series 13, image 19). IMPRESSION: Patchy cortically-based enhancement within the medial mid-to-posterior left frontal lobe (ACA vascular territory). This is likely secondary to the presence of recent infarcts at these sites. There is an additional punctate focus of abnormal enhancement within the left temporoparietal junction. This may also reflect a recent cortical infarct. However, short interval 4-week contrast-enhanced brain MRI follow-up is recommended to ensure unremarkable evolution of these findings and to exclude alternative etiologies (such as primary CNS neoplasm or intracranial metastatic disease). Electronically Signed   By: Jackey Loge D.O.   On:  12/14/2020 13:29   MR CERVICAL SPINE W CONTRAST  Result Date: 12/14/2020 CLINICAL DATA:  Demyelinating disease. EXAM: MRI CERVICAL SPINE WITH CONTRAST TECHNIQUE: Multiplanar, multisequence MR imaging of the cervical spine was performed following the administration of intravenous contrast. COMPARISON:  MRI of the cervical spine 12/13/2020. FINDINGS: A contrast-enhanced MRI of the cervical spine was performed as a follow-up to the recent prior noncontrast cervical spine MRI of 12/13/2020. The following sequences were acquired on today's exam: Sagittal T2 TSE sequence, axial T1 weighted precontrast sequence, sagittal T1 weighted postcontrast sequence, axial T1 weighted postcontrast sequence. No enhancing lesions are identified within the  cervical spinal cord. IMPRESSION: No enhancing lesions are identified within the cervical spinal cord. Electronically Signed   By: Jackey Loge D.O.   On: 12/14/2020 13:32   VAS [place] TRANSCRANIAL DOPPLER W BUBBLES  Result Date: 12/15/2020  Transcranial Doppler with Bubble Patient Name:  BAYLEA MILBURN  Date of Exam:   12/14/2020 Medical Rec #: [id number]         Accession #:    [id number] Date of Birth: Apr 14, 1960        Patient Gender: F Patient Age:   30 years Exam Location:  [facility] Procedure:      VAS [place] TRANSCRANIAL DOPPLER W BUBBLES Referring Phys: Arnetha Courser --------------------------------------------------------------------------------  Indications: Stroke. Comparison Study: No prior studies. Performing Technologist: Jean Rosenthal RDMS, RVT  Examination Guidelines: A complete evaluation includes B-mode imaging, spectral Doppler, color Doppler, and power Doppler as needed of all accessible portions of each vessel. Bilateral testing is considered an integral part of a complete examination. Limited examinations for reoccurring indications may be performed as noted.  Summary:  A vascular evaluation was performed. The left middle cerebral artery was studied. An IV was inserted into the patient's left forearm. Verbal informed consent was obtained.  Less than 10 high intensity transient signals (HITS) were heard at rest and with valsalva, indicating a spencer grade 1 patent foramen ovale (PFO). Positive TCD Bubble study indicative of a small right to left shunt *See table(s) above for TCD measurements and observations.  Diagnosing physician: Delia Heady MD Electronically signed by Delia Heady MD on 12/15/2020 at 12:45:39 PM.    Final    ECHOCARDIOGRAM COMPLETE  Result Date: 12/14/2020    ECHOCARDIOGRAM REPORT   Patient Name:   KENAE LINDQUIST Date of Exam: 12/14/2020 Medical Rec #:  [id number]        Height:       60.0 in Accession #:    [id number]       Weight:       187.6 lb Date of Birth:   06-Oct-1960       BSA:          1.817 m Patient Age:    59 years         BP:           170/79 mmHg Patient Gender: F                HR:           74 bpm. Exam Location:  Inpatient Procedure: 2D Echo, Cardiac Doppler and Color Doppler Indications:    Stroke  History:        Patient has prior history of Echocardiogram examinations and                 Patient has no prior history of Echocardiogram examinations.  Stroke.  Sonographer:    Sheralyn Boatman RDCS Referring Phys: [id number] [facility]  Sonographer Comments: Technically difficult study due to poor echo windows. IMPRESSIONS  1. Left ventricular ejection fraction, by estimation, is 60 to 65%. The left ventricle has normal function. The left ventricle has no regional wall motion abnormalities. Left ventricular diastolic parameters are consistent with Grade I diastolic dysfunction (impaired relaxation).  2. Right ventricular systolic function is normal. The right ventricular size is normal. There is normal pulmonary artery systolic pressure.  3. The mitral valve is normal in structure. Trivial mitral valve regurgitation. No evidence of mitral stenosis.  4. The aortic valve is tricuspid. There is mild calcification of the aortic valve. Aortic valve regurgitation is trivial. Mild aortic valve sclerosis is present, with no evidence of aortic valve stenosis.  5. The inferior vena cava is normal in size with greater than 50% respiratory variability, suggesting right atrial pressure of 3 mmHg. FINDINGS  Left Ventricle: Left ventricular ejection fraction, by estimation, is 60 to 65%. The left ventricle has normal function. The left ventricle has no regional wall motion abnormalities. The left ventricular internal cavity size was normal in size. There is  no left ventricular hypertrophy. Left ventricular diastolic parameters are consistent with Grade I diastolic dysfunction (impaired relaxation). Right Ventricle: The right ventricular size is normal. No increase  in right ventricular wall thickness. Right ventricular systolic function is normal. There is normal pulmonary artery systolic pressure. The tricuspid regurgitant velocity is 2.29 m/s, and  with an assumed right atrial pressure of 3 mmHg, the estimated right ventricular systolic pressure is 24.0 mmHg. Left Atrium: Left atrial size was normal in size. Right Atrium: Right atrial size was normal in size. Pericardium: There is no evidence of pericardial effusion. Mitral Valve: The mitral valve is normal in structure. Trivial mitral valve regurgitation. No evidence of mitral valve stenosis. Tricuspid Valve: The tricuspid valve is normal in structure. Tricuspid valve regurgitation is trivial. No evidence of tricuspid stenosis. Aortic Valve: The aortic valve is tricuspid. There is mild calcification of the aortic valve. Aortic valve regurgitation is trivial. Mild aortic valve sclerosis is present, with no evidence of aortic valve stenosis. Pulmonic Valve: The pulmonic valve was normal in structure. Pulmonic valve regurgitation is mild. No evidence of pulmonic stenosis. Aorta: The aortic root is normal in size and structure. Venous: The inferior vena cava is normal in size with greater than 50% respiratory variability, suggesting right atrial pressure of 3 mmHg. IAS/Shunts: No atrial level shunt detected by color flow Doppler.  LEFT VENTRICLE PLAX 2D LVIDd:         3.60 cm     Diastology LVIDs:         2.00 cm     LV e' medial:    5.55 cm/s LV PW:         1.10 cm     LV E/e' medial:  12.8 LV IVS:        1.10 cm     LV e' lateral:   8.49 cm/s LVOT diam:     1.90 cm     LV E/e' lateral: 8.4 LV SV:         69 LV SV Index:   38 LVOT Area:     2.84 cm  LV Volumes (MOD) LV vol d, MOD A2C: 38.5 ml LV vol d, MOD A4C: 43.1 ml LV vol s, MOD A2C: 13.7 ml LV vol s, MOD A4C: 17.6 ml LV SV MOD A2C:  24.8 ml LV SV MOD A4C:     43.1 ml LV SV MOD BP:      25.1 ml RIGHT VENTRICLE             IVC RV S prime:     13.80 cm/s  IVC diam: 1.60  cm TAPSE (M-mode): 2.0 cm LEFT ATRIUM             Index       RIGHT ATRIUM           Index LA diam:        3.10 cm 1.71 cm/m  RA Area:     11.20 cm LA Vol (A2C):   51.6 ml 28.41 ml/m RA Volume:   29.60 ml  16.29 ml/m LA Vol (A4C):   17.6 ml 9.69 ml/m LA Biplane Vol: 32.0 ml 17.62 ml/m  AORTIC VALVE             PULMONIC VALVE LVOT Vmax:   117.00 cm/s PR End Diast Vel: 1.11 msec LVOT Vmean:  75.900 cm/s LVOT VTI:    0.242 m  AORTA Ao Root diam: 3.50 cm Ao Asc diam:  3.30 cm MITRAL VALVE                TRICUSPID VALVE MV Area (PHT): 3.12 cm     TR Peak grad:   21.0 mmHg MV Decel Time: 243 msec     TR Vmax:        229.00 cm/s MV E velocity: 71.10 cm/s MV A velocity: 109.00 cm/s  SHUNTS MV E/A ratio:  0.65         Systemic VTI:  0.24 m                             Systemic Diam: 1.90 cm Charlton Haws MD Electronically signed by Charlton Haws MD Signature Date/Time: 12/14/2020/4:28:51 PM    Final     Scheduled Meds:  aspirin EC  81 mg Oral Daily   atorvastatin  80 mg Oral Daily   clopidogrel  75 mg Oral Daily   enoxaparin (LOVENOX) injection  40 mg Subcutaneous Q24H   Continuous Infusions:   LOS: 0 days   Time spent: 33 minutes. More than 50% of the time was spent in counseling/coordination of care  Arnetha Courser, MD [facility]  If 7PM-7AM, please contact night-coverage [email or web address]  12/15/2020, 3:38 PM   This record has been created using Conservation officer, historic buildings. Errors have been sought and corrected,but may not always be located. Such creation errors do not reflect on the standard of care.

## 2020-12-15 NOTE — Progress Notes (Signed)
Occupational Therapy Treatment Patient Details Name: Mary Fritz MRN: [id number] DOB: [birth year] Today's Date: 12/15/2020   History of present illness Pt is a 60 y.o. female admitted 9/23 after presenting to ED with c/o transient numbness and weakness RUE/LE. CT revealed small, likely nonacute lacunar infarction of the right corona radiata. MRI pending. PMH: HTN   OT comments  [name] has met her OT goals, and reports she is at her baseline. Pt is mod I for all mobility and ADLs, she completed handwriting and in hand manipulation WFL, and her RUE strength is globally 5/5. Educated pt on fall prevention, she verbalized understanding. OT will sign off acutely. D/c updated to home with no follow up.    Recommendations for follow up therapy are one component of a multi-disciplinary discharge planning process, led by the attending physician.  Recommendations may be updated based on patient status, additional functional criteria and insurance authorization.    Follow Up Recommendations  No OT follow up;Supervision - Intermittent    Equipment Recommendations  None recommended by OT       Precautions / Restrictions Precautions Precautions: None Restrictions Weight Bearing Restrictions: No       Mobility Bed Mobility Overal bed mobility: Modified Independent        Transfers Overall transfer level: Needs assistance Equipment used: None Transfers: Sit to/from Stand Sit to Stand: Modified independent (Device/Increase time)         General transfer comment: no safety concerns, no LOB    Balance Overall balance assessment: Modified Independent               ADL either performed or assessed with clinical judgement   ADL Overall ADL's : Needs assistance/impaired               General ADL Comments: pt is completing ADLs at her baseline, independently no AD no DME      Cognition Arousal/Alertness: Awake/alert Behavior During Therapy: WFL for tasks  assessed/performed Overall Cognitive Status: Within Functional Limits for tasks assessed                  General Comments Pt reports being at her baseline; VSS on RA, pt pperforming all tasks independently    Pertinent Vitals/ Pain       Pain Assessment: No/denies pain Faces Pain Scale: No hurt Pain Intervention(s): Limited activity within patient's tolerance      Progress Toward Goals  OT Goals(current goals can now be found in the care plan section)  Progress towards OT goals: Goals met/education completed, patient discharged from OT  Acute Rehab OT Goals Patient Stated Goal: home OT Goal Formulation: With patient Time For Goal Achievement: 12/27/20 Potential to Achieve Goals: Fair ADL Goals Pt Will Perform Grooming: with modified independence;standing Pt Will Perform Lower Body Bathing: with modified independence;sit to/from stand Pt Will Perform Lower Body Dressing: with modified independence;sit to/from stand Pt/caregiver will Perform Home Exercise Program: Increased strength;Right Upper extremity;With written HEP provided  Plan Discharge plan needs to be updated       AM-PAC OT "6 Clicks" Daily Activity     Outcome Measure   Help from another person eating meals?: None Help from another person taking care of personal grooming?: None Help from another person toileting, which includes using toliet, bedpan, or urinal?: None Help from another person bathing (including washing, rinsing, drying)?: None Help from another person to put on and taking off regular upper body clothing?: None Help from another person to put on and taking  off regular lower body clothing?: None 6 Click Score: 24    End of Session    OT Visit Diagnosis: Unsteadiness on feet (R26.81);Repeated falls (R29.6);Other abnormalities of gait and mobility (R26.89);Muscle weakness (generalized) (M62.81);Other (comment);Hemiplegia and hemiparesis   Activity Tolerance Patient tolerated treatment well    Patient Left in bed;with call bell/phone within reach;with family/visitor present   Nurse Communication Mobility status        Time: 7703-4035 OT Time Calculation (min): 18 min  Charges: OT General Charges $OT Visit: 1 Visit OT Treatments $Self Care/Home Management : 8-22 mins   Mary Fritz A Kayci Fritz 12/15/2020, 4:42 PM

## 2020-12-15 NOTE — TOC Initial Note (Signed)
Transition of Care [facility]) - Initial/Assessment Note    Patient Details  Name: Mary Fritz MRN: [id number] Date of Birth: [birth year]  Transition of Care [facility]) CM/SW Contact:    Pollie Friar, RN Phone Number: 12/15/2020, 11:45 AM  Clinical Narrative:                 Patient lives with her adult son. CM met with the patient and her son at the bedside. CM used spanish interpreter for the conversation. Son states he is able to provide needed supervision at home.  Recommendations are for [facility] OT. Pt can not receive HH OT without PT. Also Pt will have co pays at home. Pt agreeable to outpatient therapy at [facility] therapy as she lives in [place]. CM will fax orders to [facility].  No DME needs.  Patient denies issues with transportation or home medications. She has PCP. TOC following.  Expected Discharge Plan: OP Rehab Barriers to Discharge: Continued Medical Work up   Patient Goals and CMS Choice   CMS Medicare.gov Compare Post Acute Care list provided to:: Patient Choice offered to / list presented to : Patient, Adult Children  Expected Discharge Plan and Services Expected Discharge Plan: OP Rehab   Discharge Planning Services: CM Consult   Living arrangements for the past 2 months: Single Family Home                                      Prior Living Arrangements/Services Living arrangements for the past 2 months: Single Family Home Lives with:: Adult Children (son) Patient language and need for interpreter reviewed:: Yes (used spanish interpreter to speak with son and patient) Do you feel safe going back to the place where you live?: Yes      Need for Family Participation in Patient Care: Yes (Comment) Care giver support system in place?: Yes (comment) (Son can provide 24 hour supervision)   Criminal Activity/Legal Involvement Pertinent to Current Situation/Hospitalization: No - Comment as needed  Activities of Daily Living Home  Assistive Devices/Equipment: None ADL Screening (condition at time of admission) Patient's cognitive ability adequate to safely complete daily activities?: Yes Is the patient deaf or have difficulty hearing?: No Does the patient have difficulty seeing, even when wearing glasses/contacts?: No Does the patient have difficulty concentrating, remembering, or making decisions?: No Patient able to express need for assistance with ADLs?: Yes (speaks spanish) Does the patient have difficulty dressing or bathing?: No Independently performs ADLs?: Yes (appropriate for developmental age) Does the patient have difficulty walking or climbing stairs?: Yes Weakness of Legs: Right Weakness of Arms/Hands: None  Permission Sought/Granted                  Emotional Assessment Appearance:: Appears stated age Attitude/Demeanor/Rapport: Engaged Affect (typically observed): Accepting Orientation: : Oriented to Place, Oriented to  Time, Oriented to Situation, Oriented to Self   Psych Involvement: No (comment)  Admission diagnosis:  Stroke [facility]) [I63.9] Cerebrovascular accident (CVA), unspecified mechanism [facility]) [I63.9] Patient Active Problem List   Diagnosis Date Noted   Stroke [facility]) 12/12/2020   PCP:  Street, Sharon Mt, MD Pharmacy:   [facility], [place] [address]  [place] [address] Phone: [phone] Fax: [phone]     Social Determinants of Health (SDOH) Interventions    Readmission Risk Interventions No flowsheet data found.

## 2020-12-15 NOTE — Progress Notes (Signed)
    [facility] has been requested to perform a transesophageal echocardiogram on 09/27 for CVA.  After careful review of history and examination, the risks and benefits of transesophageal echocardiogram have been explained including risks of esophageal damage, perforation (1:10,000 risk), bleeding, pharyngeal hematoma as well as other potential complications associated with conscious sedation including aspiration, arrhythmia, respiratory failure and death. Alternatives to treatment were discussed, questions were answered. Patient is willing to proceed. Pt son is present and also agrees.  Video interpreter used.  Theodore Demark, PA-C 12/15/2020 2:14 PM

## 2020-12-15 NOTE — Progress Notes (Signed)
STROKE TEAM PROGRESS NOTE   INTERVAL HISTORY Patient is sitting up in bed comfortably.  Husband is at bedside.  TEE scheduled for tomorrow.  TCD bubble study done yesterday was positive for small right to left shunt.  Lower extremity venous Dopplers are pending.  She has no new complaints.  Vital signs stable.  Neurological exam unchanged.    Vitals:   12/15/20 0000 12/15/20 0445 12/15/20 0749 12/15/20 1232  BP: 114/74 119/63 125/74 139/78  Pulse: 72 69 80 84  Resp:  18    Temp: 98.4 F (36.9 C) 98.4 F (36.9 C) 98.4 F (36.9 C) 98.3 F (36.8 C)  TempSrc: Oral Oral Oral Oral  SpO2: 98% 96% 99% 98%  Weight:      Height:       CBC:  Recent Labs  Lab 12/12/20 1748 12/13/20 0559  WBC 10.6* 8.3  NEUTROABS 6.2  --   HGB 14.4 13.2  HCT 43.6 39.4  MCV 87.7 89.3  PLT 317 292   Basic Metabolic Panel:  Recent Labs  Lab 12/12/20 1748 12/13/20 0559  NA 138 137  K 3.9 4.0  CL 103 104  CO2 25 26  GLUCOSE 88 109*  BUN 12 10  CREATININE 0.67 0.74  CALCIUM 9.9 9.0    Lipid Panel:  Recent Labs  Lab 12/13/20 0559  CHOL 227*  TRIG 122  HDL 42  CHOLHDL 5.4  VLDL 24  LDLCALC 220*    HgbA1c:  Recent Labs  Lab 12/13/20 0559  HGBA1C 5.7*   Urine Drug Screen:  Recent Labs  Lab 12/13/20 1545  LABOPIA NONE DETECTED  COCAINSCRNUR NONE DETECTED  LABBENZ NONE DETECTED  AMPHETMU NONE DETECTED  THCU NONE DETECTED  LABBARB POSITIVE*    Alcohol Level No results for input(s): ETH in the last 168 hours.  IMAGING past 24 hours VAS [place] TRANSCRANIAL DOPPLER W BUBBLES  Result Date: 12/15/2020  Transcranial Doppler with Bubble Patient Name:  MARDEL GRUDZIEN  Date of Exam:   12/14/2020 Medical Rec #: [id number]         Accession #:    [id number] Date of Birth: 1960/07/11        Patient Gender: F Patient Age:   60 years Exam Location:  [facility] Procedure:      VAS [place] TRANSCRANIAL DOPPLER W BUBBLES Referring Phys: Arnetha Courser  --------------------------------------------------------------------------------  Indications: Stroke. Comparison Study: No prior studies. Performing Technologist: Jean Rosenthal RDMS, RVT  Examination Guidelines: A complete evaluation includes B-mode imaging, spectral Doppler, color Doppler, and power Doppler as needed of all accessible portions of each vessel. Bilateral testing is considered an integral part of a complete examination. Limited examinations for reoccurring indications may be performed as noted.  Summary:  A vascular evaluation was performed. The left middle cerebral artery was studied. An IV was inserted into the patient's left forearm. Verbal informed consent was obtained.  Less than 10 high intensity transient signals (HITS) were heard at rest and with valsalva, indicating a spencer grade 1 patent foramen ovale (PFO). Positive TCD Bubble study indicative of a small right to left shunt *See table(s) above for TCD measurements and observations.  Diagnosing physician: Delia Heady MD Electronically signed by Delia Heady MD on 12/15/2020 at 12:45:39 PM.    Final    ECHOCARDIOGRAM COMPLETE  Result Date: 12/14/2020    ECHOCARDIOGRAM REPORT   Patient Name:   MAURYA NETHERY Date of Exam: 12/14/2020 Medical Rec #:  [id number]        Height:  60.0 in Accession #:    [id number]       Weight:       187.6 lb Date of Birth:  [birth year]       BSA:          1.817 m Patient Age:    60 years         BP:           170/79 mmHg Patient Gender: F                HR:           74 bpm. Exam Location:  Inpatient Procedure: 2D Echo, Cardiac Doppler and Color Doppler Indications:    Stroke  History:        Patient has prior history of Echocardiogram examinations and                 Patient has no prior history of Echocardiogram examinations.                 Stroke.  Sonographer:    Sheralyn Boatman RDCS Referring Phys: [id number] [facility]  Sonographer Comments: Technically difficult study due to poor echo windows.  IMPRESSIONS  1. Left ventricular ejection fraction, by estimation, is 60 to 65%. The left ventricle has normal function. The left ventricle has no regional wall motion abnormalities. Left ventricular diastolic parameters are consistent with Grade I diastolic dysfunction (impaired relaxation).  2. Right ventricular systolic function is normal. The right ventricular size is normal. There is normal pulmonary artery systolic pressure.  3. The mitral valve is normal in structure. Trivial mitral valve regurgitation. No evidence of mitral stenosis.  4. The aortic valve is tricuspid. There is mild calcification of the aortic valve. Aortic valve regurgitation is trivial. Mild aortic valve sclerosis is present, with no evidence of aortic valve stenosis.  5. The inferior vena cava is normal in size with greater than 50% respiratory variability, suggesting right atrial pressure of 3 mmHg. FINDINGS  Left Ventricle: Left ventricular ejection fraction, by estimation, is 60 to 65%. The left ventricle has normal function. The left ventricle has no regional wall motion abnormalities. The left ventricular internal cavity size was normal in size. There is  no left ventricular hypertrophy. Left ventricular diastolic parameters are consistent with Grade I diastolic dysfunction (impaired relaxation). Right Ventricle: The right ventricular size is normal. No increase in right ventricular wall thickness. Right ventricular systolic function is normal. There is normal pulmonary artery systolic pressure. The tricuspid regurgitant velocity is 2.29 m/s, and  with an assumed right atrial pressure of 3 mmHg, the estimated right ventricular systolic pressure is 24.0 mmHg. Left Atrium: Left atrial size was normal in size. Right Atrium: Right atrial size was normal in size. Pericardium: There is no evidence of pericardial effusion. Mitral Valve: The mitral valve is normal in structure. Trivial mitral valve regurgitation. No evidence of mitral valve  stenosis. Tricuspid Valve: The tricuspid valve is normal in structure. Tricuspid valve regurgitation is trivial. No evidence of tricuspid stenosis. Aortic Valve: The aortic valve is tricuspid. There is mild calcification of the aortic valve. Aortic valve regurgitation is trivial. Mild aortic valve sclerosis is present, with no evidence of aortic valve stenosis. Pulmonic Valve: The pulmonic valve was normal in structure. Pulmonic valve regurgitation is mild. No evidence of pulmonic stenosis. Aorta: The aortic root is normal in size and structure. Venous: The inferior vena cava is normal in size with greater than 50% respiratory variability, suggesting right atrial  pressure of 3 mmHg. IAS/Shunts: No atrial level shunt detected by color flow Doppler.  LEFT VENTRICLE PLAX 2D LVIDd:         3.60 cm     Diastology LVIDs:         2.00 cm     LV e' medial:    5.55 cm/s LV PW:         1.10 cm     LV E/e' medial:  12.8 LV IVS:        1.10 cm     LV e' lateral:   8.49 cm/s LVOT diam:     1.90 cm     LV E/e' lateral: 8.4 LV SV:         69 LV SV Index:   38 LVOT Area:     2.84 cm  LV Volumes (MOD) LV vol d, MOD A2C: 38.5 ml LV vol d, MOD A4C: 43.1 ml LV vol s, MOD A2C: 13.7 ml LV vol s, MOD A4C: 17.6 ml LV SV MOD A2C:     24.8 ml LV SV MOD A4C:     43.1 ml LV SV MOD BP:      25.1 ml RIGHT VENTRICLE             IVC RV S prime:     13.80 cm/s  IVC diam: 1.60 cm TAPSE (M-mode): 2.0 cm LEFT ATRIUM             Index       RIGHT ATRIUM           Index LA diam:        3.10 cm 1.71 cm/m  RA Area:     11.20 cm LA Vol (A2C):   51.6 ml 28.41 ml/m RA Volume:   29.60 ml  16.29 ml/m LA Vol (A4C):   17.6 ml 9.69 ml/m LA Biplane Vol: 32.0 ml 17.62 ml/m  AORTIC VALVE             PULMONIC VALVE LVOT Vmax:   117.00 cm/s PR End Diast Vel: 1.11 msec LVOT Vmean:  75.900 cm/s LVOT VTI:    0.242 m  AORTA Ao Root diam: 3.50 cm Ao Asc diam:  3.30 cm MITRAL VALVE                TRICUSPID VALVE MV Area (PHT): 3.12 cm     TR Peak grad:   21.0 mmHg  MV Decel Time: 243 msec     TR Vmax:        229.00 cm/s MV E velocity: 71.10 cm/s MV A velocity: 109.00 cm/s  SHUNTS MV E/A ratio:  0.65         Systemic VTI:  0.24 m                             Systemic Diam: 1.90 cm Charlton Haws MD Electronically signed by Charlton Haws MD Signature Date/Time: 12/14/2020/4:28:51 PM    Final     PHYSICAL EXAM  Pleasant middle-aged Hispanic lady overweight; no distress. Psych: Affect appropriate to situation Eyes: No scleral injection HENT: No OP obstrucion Head: Normocephalic.  Cardiovascular: Normal rate and regular rhythm.  Respiratory: Effort normal and breath sounds normal to anterior ascultation GI: Soft.  No distension. There is no tenderness.  Skin: WDI    Neurological Examination Mental Status: Alert, oriented, thought content appropriate.  Speech fluent without evidence of aphasia. Able to follow 3 step  commands without difficulty. Cranial Nerves: II: Visual fields grossly normal,  III,IV, VI: ptosis not present, extra-ocular motions intact bilaterally, pupils equal, round, reactive to light and accommodation V,VII: smile symmetric, facial light touch sensation normal bilaterally VIII: hearing normal bilaterally IX,X: uvula rises symmetrically XI: bilateral shoulder shrug XII: midline tongue extension Motor: Right : Upper extremity   5/5    Left:     Upper extremity   5/5  Lower extremity   5/5     Lower extremity   5/5 Tone and bulk:normal tone throughout; no atrophy noted Sensory: Pinprick and light touch intact throughout, bilaterally Deep Tendon Reflexes: 2+ and symmetric throughout Plantars: Right: downgoing   Left: downgoing Cerebellar: normal finger-to-nose, normal rapid alternating movements and normal heel-to-shin test Gait: normal gait and station   ASSESSMENT/PLAN Ms. Braylyn Eye is a 60 y.o. female with with transient numbness and weakness in the right side which has resolved except for mild decreased sensation to cold  on right and subjective numbness on the right mid-shin to toes. MRI brain and Cspine are abnormal and will check MRI w/contrast to better evaluate for secondary process such as MS.   Stroke:  left patchy ACA infarct embolic appearing. Cryptogenic at this time, if TTE/TCD w/bubble neg, may need loop placed.  MRI  patchy Left ACA stroke, chronic small vessel disease and old lacunar infarct. Also there are multiple periventricular lesions.  MRI Cspine neg, but possible demyelination lesion seen at C3-4 MRA  no extracranial stenosis. There is Left A1 near occlusion vs severe stenosis. Possible Lt V4 stenosis.  2D Echo - EF 60 - 65%. No cardiac source of emboli identified.  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 LDL 161 HgbA1c 5.7 VTE prophylaxis - lovenox    Diet   Diet Heart Room service appropriate? Yes; Fluid consistency: Thin   clopidogrel 75 mg daily prior to admission, now on aspirin 81 mg daily and clopidogrel 75 mg daily. Continue DAPT x 3 weeks and back to Plavix alone, unless Afib detected, then recommend Eliquis. Therapy recommendations:  No f/u PT or speech. HH OT recommended. Disposition:  pending  Hypertension Home meds:  none Stable Permissive hypertension (OK if < 220/120) but gradually normalize in 5-7 days Long-term BP goal normotensive  Hyperlipidemia Home meds:  none LDL 161, goal < 70 Add Lipitor 80mg  po daily  High intensity statin  Continue statin at discharge  Diabetes type II: no dx HgbA1c 5.7, goal < 7.0 CBGs No results for input(s): GLUCAP in the last 72 hours.  SSI  Other Stroke Risk Factors Obesity, Body mass index is 36.64 kg/m., BMI >/= 30 associated with increased stroke risk, recommend weight loss, diet and exercise as appropriate  Hx stroke/TIA Small PFO unlikely to be of clinical significance attributable cause of stroke- ROPE score 5 only 34% chance that stroke is due to PFO and a 2-year recurrence  of stroke/TIA risk of 7%   Recommend continue mobilization out of bed and ongoing therapies.  Check lower extremity venous Doppler for DVT.  TEE tomorrow followed by a loop recorder prior to discharge.  Discussed with patient and husband and answered questions.  Discussed with Dr. .  Greater than 50% time during this 25-minute visit was spent of counseling and coordination of care and discussion with care team and answering questions stroke and PFO  Nelson Chimes MD      To contact Stroke Continuity provider, please refer to Delia Heady.  After hours, contact General Neurology

## 2020-12-15 NOTE — Progress Notes (Signed)
Physical Therapy Treatment Patient Details Name: Mary Fritz MRN: [id number] DOB: [birth year] Today's Date: 12/15/2020   History of Present Illness Pt is a 60 y.o. female admitted 9/23 after presenting to ED with c/o transient numbness and weakness RUE/LE. CT revealed small, likely nonacute lacunar infarction of the right corona radiata. MRI pending. PMH: HTN    PT Comments    Patient received in bed, pleasant and cooperative today; utilized [place] interpreter for communication this session. Able to mobilize very well with therapy and tells me she actually feels back to her baseline. Left up in recliner with all needs met, family present. Would benefit from higher level balance challenge and stair training next session, however suspect she will likely meet all PT goals within the next couple of sessions if she continues to improve at this rate.     Recommendations for follow up therapy are one component of a multi-disciplinary discharge planning process, led by the attending physician.  Recommendations may be updated based on patient status, additional functional criteria and insurance authorization.  Follow Up Recommendations  No PT follow up     Equipment Recommendations  None recommended by PT    Recommendations for Other Services       Precautions / Restrictions Precautions Precautions: None Restrictions Weight Bearing Restrictions: No     Mobility  Bed Mobility Overal bed mobility: Modified Independent                  Transfers Overall transfer level: Needs assistance Equipment used: None Transfers: Sit to/from Stand Sit to Stand: Supervision         General transfer comment: supervision for safety  Ambulation/Gait Ambulation/Gait assistance: Min guard Gait Distance (Feet): 300 Feet Assistive device: None Gait Pattern/deviations: Step-through pattern;Decreased stride length Gait velocity: decreased   General Gait Details: min guard for safety,  no LOB or fatigue noted and very sateady during gait. Tells me she is back to her usual/baseline level of function   Stairs             Wheelchair Mobility    Modified Rankin (Stroke Patients Only) Modified Rankin (Stroke Patients Only) Pre-Morbid Rankin Score: No symptoms Modified Rankin: Slight disability     Balance Overall balance assessment: Mild deficits observed, not formally tested                                          Cognition Arousal/Alertness: Awake/alert Behavior During Therapy: WFL for tasks assessed/performed Overall Cognitive Status: Within Functional Limits for tasks assessed                                 General Comments: Appropriate. Following commands. Good awareness.      Exercises      General Comments        Pertinent Vitals/Pain Pain Assessment: No/denies pain Faces Pain Scale: No hurt Pain Intervention(s): Limited activity within patient's tolerance;Monitored during session    Home Living                      Prior Function            PT Goals (current goals can now be found in the care plan section) Acute Rehab PT Goals Patient Stated Goal: home PT Goal Formulation: With patient/family Time For Goal Achievement: 12/27/20  Potential to Achieve Goals: Good Progress towards PT goals: Progressing toward goals    Frequency    Min 4X/week      PT Plan Current plan remains appropriate    Co-evaluation              AM-PAC PT "6 Clicks" Mobility   Outcome Measure  Help needed turning from your back to your side while in a flat bed without using bedrails?: None Help needed moving from lying on your back to sitting on the side of a flat bed without using bedrails?: None Help needed moving to and from a bed to a chair (including a wheelchair)?: None Help needed standing up from a chair using your arms (e.g., wheelchair or bedside chair)?: None Help needed to walk in hospital  room?: A Little Help needed climbing 3-5 steps with a railing? : A Little 6 Click Score: 22    End of Session Equipment Utilized During Treatment: Gait belt Activity Tolerance: Patient tolerated treatment well Patient left: in chair;with call bell/phone within reach;with family/visitor present Nurse Communication: Mobility status PT Visit Diagnosis: Difficulty in walking, not elsewhere classified (R26.2)     Time: 9030-1499 PT Time Calculation (min) (ACUTE ONLY): 20 min  Charges:  $Gait Training: 8-22 mins                    Windell Norfolk, DPT, PN2   Supplemental Physical Therapist [place]    Pager [phone] Acute Rehab Office [phone]

## 2020-12-16 ENCOUNTER — Inpatient Hospital Stay (HOSPITAL_COMMUNITY): Payer: Commercial Managed Care - PPO

## 2020-12-16 ENCOUNTER — Encounter (HOSPITAL_COMMUNITY)
Admission: EM | Disposition: A | Discharge status: Home / Self Care | Payer: Self-pay | Source: Home / Self Care | Attending: Internal Medicine

## 2020-12-16 ENCOUNTER — Encounter (HOSPITAL_COMMUNITY): Payer: Self-pay | Admitting: Internal Medicine

## 2020-12-16 ENCOUNTER — Inpatient Hospital Stay (HOSPITAL_COMMUNITY): Payer: Commercial Managed Care - PPO | Admitting: Anesthesiology

## 2020-12-16 DIAGNOSIS — I639 Cerebral infarction, unspecified: Secondary | ICD-10-CM

## 2020-12-16 DIAGNOSIS — R609 Edema, unspecified: Secondary | ICD-10-CM

## 2020-12-16 DIAGNOSIS — I480 Paroxysmal atrial fibrillation: Secondary | ICD-10-CM

## 2020-12-16 HISTORY — PX: BUBBLE STUDY: SHX6837

## 2020-12-16 HISTORY — PX: LOOP RECORDER INSERTION: EP1214

## 2020-12-16 HISTORY — PX: TEE WITHOUT CARDIOVERSION: SHX5443

## 2020-12-16 SURGERY — ECHOCARDIOGRAM, TRANSESOPHAGEAL
Anesthesia: Monitor Anesthesia Care

## 2020-12-16 SURGERY — LOOP RECORDER INSERTION

## 2020-12-16 MED ORDER — ATORVASTATIN CALCIUM 80 MG PO TABS: 80.0000 mg | ORAL_TABLET | Freq: Every day | ORAL | 1 refills | Status: AC

## 2020-12-16 MED ORDER — ASPIRIN 81 MG PO TBEC: 81.0000 mg | DELAYED_RELEASE_TABLET | Freq: Every day | ORAL | 0 refills | Status: AC

## 2020-12-16 MED ORDER — LIDOCAINE HCL (CARDIAC) PF 100 MG/5ML IV SOSY
PREFILLED_SYRINGE | INTRAVENOUS | Status: DC | PRN
  Administered 2020-12-16: 60 mg via INTRAVENOUS

## 2020-12-16 MED ORDER — PHENYLEPHRINE 40 MCG/ML (10ML) SYRINGE FOR IV PUSH (FOR BLOOD PRESSURE SUPPORT)
PREFILLED_SYRINGE | INTRAVENOUS | Status: DC | PRN
  Administered 2020-12-16: 80 ug via INTRAVENOUS

## 2020-12-16 MED ORDER — LIDOCAINE-EPINEPHRINE 1 %-1:100000 IJ SOLN
INTRAMUSCULAR | Status: AC
  Filled 2020-12-16: qty 1

## 2020-12-16 MED ORDER — LIDOCAINE-EPINEPHRINE 1 %-1:100000 IJ SOLN
INTRAMUSCULAR | Status: DC | PRN
  Administered 2020-12-16: 10 mL

## 2020-12-16 MED ORDER — PROPOFOL 500 MG/50ML IV EMUL
INTRAVENOUS | Status: DC | PRN
  Administered 2020-12-16: 150 ug/kg/min via INTRAVENOUS

## 2020-12-16 MED ORDER — LACTATED RINGERS IV SOLN: INTRAVENOUS | Status: DC | PRN

## 2020-12-16 MED ORDER — CLOPIDOGREL BISULFATE 75 MG PO TABS: 75.0000 mg | ORAL_TABLET | Freq: Every day | ORAL | 1 refills | Status: AC

## 2020-12-16 MED ORDER — ASPIRIN 81 MG PO TBEC
81.0000 mg | DELAYED_RELEASE_TABLET | Freq: Every day | ORAL | 11 refills | Status: DC
Start: 2020-12-17 — End: 2020-12-16

## 2020-12-16 MED ORDER — SODIUM CHLORIDE 0.9 % IV SOLN: INTRAVENOUS | Status: DC

## 2020-12-16 SURGICAL SUPPLY — 2 items
MONITOR MOBILE MNGR LINQ22 (Prosthesis & Implant Heart) ×1 IMPLANT
PACK LOOP INSERTION (CUSTOM PROCEDURE TRAY) ×2 IMPLANT

## 2020-12-16 NOTE — Anesthesia Preprocedure Evaluation (Addendum)
Anesthesia Evaluation  Patient identified by MRN, date of birth, ID band Patient awake    Reviewed: Allergy & Precautions, NPO status , Patient's Chart, lab work & pertinent test results  History of Anesthesia Complications Negative for: history of anesthetic complications  Airway Mallampati: I  TM Distance: >3 FB Neck ROM: Full    Dental  (+) Edentulous Upper, Partial Lower, Loose, Dental Advisory Given   Pulmonary neg pulmonary ROS,    Pulmonary exam normal        Cardiovascular hypertension, Pt. on medications Normal cardiovascular exam  IMPRESSIONS    1. Left ventricular ejection fraction, by estimation, is 60 to 65%. The  left ventricle has normal function. The left ventricle has no regional  wall motion abnormalities. Left ventricular diastolic parameters are  consistent with Grade I diastolic  dysfunction (impaired relaxation).  2. Right ventricular systolic function is normal. The right ventricular  size is normal. There is normal pulmonary artery systolic pressure.  3. The mitral valve is normal in structure. Trivial mitral valve  regurgitation. No evidence of mitral stenosis.  4. The aortic valve is tricuspid. There is mild calcification of the  aortic valve. Aortic valve regurgitation is trivial. Mild aortic valve  sclerosis is present, with no evidence of aortic valve stenosis.  5. The inferior vena cava is normal in size with greater than 50%  respiratory variability, suggesting right atrial pressure of 3 mmHg.    Neuro/Psych CVA, No Residual Symptoms    GI/Hepatic negative GI ROS, Neg liver ROS,   Endo/Other  negative endocrine ROS  Renal/GU negative Renal ROS     Musculoskeletal negative musculoskeletal ROS (+)   Abdominal   Peds  Hematology negative hematology ROS (+)   Anesthesia Other Findings   Reproductive/Obstetrics                            Anesthesia  Physical Anesthesia Plan  ASA: 3  Anesthesia Plan: MAC   Post-op Pain Management:    Induction:   PONV Risk Score and Plan: 2 and Ondansetron and Propofol infusion  Airway Management Planned: Natural Airway  Additional Equipment:   Intra-op Plan:   Post-operative Plan:   Informed Consent: I have reviewed the patients History and Physical, chart, labs and discussed the procedure including the risks, benefits and alternatives for the proposed anesthesia with the patient or authorized representative who has indicated his/her understanding and acceptance.     Dental advisory given and Interpreter used for interveiw  Plan Discussed with: Anesthesiologist, CRNA and Surgeon  Anesthesia Plan Comments:        Anesthesia Quick Evaluation

## 2020-12-16 NOTE — Anesthesia Procedure Notes (Signed)
Procedure Name: MAC Date/Time: 12/16/2020 10:12 AM Performed by: Lieutenant Diego, CRNA Pre-anesthesia Checklist: Patient identified, Emergency Drugs available, Suction available, Patient being monitored and Timeout performed Patient Re-evaluated:Patient Re-evaluated prior to induction Oxygen Delivery Method: Nasal cannula Preoxygenation: Pre-oxygenation with 100% oxygen Induction Type: IV induction

## 2020-12-16 NOTE — Interval H&P Note (Signed)
History and Physical Interval Note:  12/16/2020 9:28 AM  Mary Fritz  has presented today for surgery, with the diagnosis of STROKE.  The various methods of treatment have been discussed with the patient and family. After consideration of risks, benefits and other options for treatment, the patient has consented to  Procedure(s): TRANSESOPHAGEAL ECHOCARDIOGRAM (TEE) (N/A) as a surgical intervention.  The patient's history has been reviewed, patient examined, no change in status, stable for surgery.  I have reviewed the patient's chart and labs.  Questions were answered to the patient's satisfaction.     Olga Millers

## 2020-12-16 NOTE — TOC Transition Note (Signed)
Transition of Care [facility]) - CM/SW Discharge Note   Patient Details  Name: Mary Fritz MRN: [id number] Date of Birth: 1960/04/20  Transition of Care [facility]) CM/SW Contact:  Kermit Balo, RN Phone Number: 12/16/2020, 4:16 PM   Clinical Narrative:    Patient is discharging home with self care. No needs per PT/OT. No DME needs. Family providing transport home.   Final next level of care: Home/Self Care Barriers to Discharge: No Barriers Identified   Patient Goals and CMS Choice   CMS Medicare.gov Compare Post Acute Care list provided to:: Patient Choice offered to / list presented to : Patient, Adult Children  Discharge Placement                       Discharge Plan and Services   Discharge Planning Services: CM Consult                                 Social Determinants of Health (SDOH) Interventions     Readmission Risk Interventions No flowsheet data found.

## 2020-12-16 NOTE — Consult Note (Signed)
ELECTROPHYSIOLOGY CONSULT NOTE  Patient ID: Mary Fritz MRN: [id number], DOB/AGE: 1960-11-03   Admit date: 12/12/2020 Date of Consult: 12/16/2020  Primary Physician: Casper Harrison, Stephanie Coup, MD Primary Cardiologist: none Reason for Consultation: Cryptogenic stroke ; recommendations regarding Implantable Loop Recorder, requested by Dr. Pearlean Brownie  History of Present Illness Mary Fritz was admitted on 12/12/2020 with transient R sided numbness, found with stroke.    PMHx includes:HTN only prior to this hospitalization  Neurology notes:   left patchy ACA infarct embolic appearing. Cryptogenic at this time, if TTE/TCD w/bubble neg, may need loop placed.  she has undergone workup for stroke including echocardiogram and carotid angio.  The patient has been monitored on telemetry which has demonstrated sinus rhythm with no arrhythmias.  Inpatient stroke work-up is to be completed with a TEE.   Echocardiogram this admission demonstrated    IMPRESSIONS   1. Left ventricular ejection fraction, by estimation, is 60 to 65%. The  left ventricle has normal function. The left ventricle has no regional  wall motion abnormalities. Left ventricular diastolic parameters are  consistent with Grade I diastolic  dysfunction (impaired relaxation).   2. Right ventricular systolic function is normal. The right ventricular  size is normal. There is normal pulmonary artery systolic pressure.   3. The mitral valve is normal in structure. Trivial mitral valve  regurgitation. No evidence of mitral stenosis.   4. The aortic valve is tricuspid. There is mild calcification of the  aortic valve. Aortic valve regurgitation is trivial. Mild aortic valve  sclerosis is present, with no evidence of aortic valve stenosis.   5. The inferior vena cava is normal in size with greater than 50%  respiratory variability, suggesting right atrial pressure of 3 mmHg.   Positive TCD Bubble study indicative of a small right  to left shunt   LE venous dopplers: ordered,pending   Lab work is reviewed.    TODAY'S VISIT WAS ASSISTED BY VIDEO TRANSLATORChristiane Ha # [id number]  Prior to admission, the patient denies chest pain, shortness of breath, dizziness, palpitations, or syncope.  They are recovering from their stroke with plans to home at discharge.      Past Medical History:  Diagnosis Date   CVA (cerebral vascular accident) (HCC) 2017   Hypertension      Surgical History:  Past Surgical History:  Procedure Laterality Date   CHOLECYSTECTOMY     EXPLORATORY LAPAROTOMY       Medications Prior to Admission  Medication Sig Dispense Refill Last Dose   clopidogrel (PLAVIX) 75 MG tablet Take 75 mg by mouth daily.   12/12/2020   ibuprofen (ADVIL) 200 MG tablet Take 200 mg by mouth daily as needed for headache (pain).   12/08/2020    Inpatient Medications:   aspirin EC  81 mg Oral Daily   atorvastatin  80 mg Oral Daily   clopidogrel  75 mg Oral Daily   enoxaparin (LOVENOX) injection  40 mg Subcutaneous Q24H    Allergies: No Known Allergies  Social History   Socioeconomic History   Marital status: Single    Spouse name: Not on file   Number of children: Not on file   Years of education: Not on file   Highest education level: Not on file  Occupational History   Not on file  Tobacco Use   Smoking status: Never   Smokeless tobacco: Never  Vaping Use   Vaping Use: Never used  Substance and Sexual Activity   Alcohol use: Never  Drug use: Never   Sexual activity: Not on file  Other Topics Concern   Not on file  Social History Narrative   Not on file   Social Determinants of Health   Financial Resource Strain: Not on file  Food Insecurity: Not on file  Transportation Needs: Not on file  Physical Activity: Not on file  Stress: Not on file  Social Connections: Not on file  Intimate Partner Violence: Not on file     History reviewed. DM in a brother only   Review of  Systems: All other systems reviewed and are otherwise negative except as noted above.  Physical Exam: Vitals:   12/15/20 2009 12/15/20 2340 12/16/20 0430 12/16/20 0802  BP: 127/70 125/72 126/70 123/68  Pulse: 77 75 73 72  Resp: 18 18 18 14   Temp: 98.2 F (36.8 C) 98.3 F (36.8 C) 98.1 F (36.7 C) 98.8 F (37.1 C)  TempSrc: Oral Oral Oral Oral  SpO2: 97% 98% 95% 96%  Weight:      Height:        GEN- The patient is well appearing, alert and oriented x 3 today.   Head- normocephalic, atraumatic Eyes-  Sclera clear, conjunctiva pink Ears- hearing intact Oropharynx- clear Neck- supple Lungs- CTA b/l, normal work of breathing Heart- RRR, no murmurs, rubs or gallops  GI- soft, NT, ND Extremities- no clubbing, cyanosis, or edema MS- no significant deformity or atrophy Skin- no rash or lesion Psych- euthymic mood, full affect   Labs:   Lab Results  Component Value Date   WBC 8.3 12/13/2020   HGB 13.2 12/13/2020   HCT 39.4 12/13/2020   MCV 89.3 12/13/2020   PLT 292 12/13/2020    Recent Labs  Lab 12/13/20 0559  NA 137  K 4.0  CL 104  CO2 26  BUN 10  CREATININE 0.74  CALCIUM 9.0  PROT 7.0  BILITOT 0.9  ALKPHOS 53  ALT 23  AST 28  GLUCOSE 109*   No results found for: CKTOTAL, CKMB, CKMBINDEX, TROPONINI Lab Results  Component Value Date   CHOL 227 (H) 12/13/2020   Lab Results  Component Value Date   HDL 42 12/13/2020   Lab Results  Component Value Date   LDLCALC 161 (H) 12/13/2020   Lab Results  Component Value Date   TRIG 122 12/13/2020   Lab Results  Component Value Date   CHOLHDL 5.4 12/13/2020   No results found for: LDLDIRECT  No results found for: DDIMER   Radiology/Studies:  CT HEAD WO CONTRAST (12/15/2020) Result Date: 12/12/2020 CLINICAL DATA:  Right arm and leg numbness and heaviness for 1 month, increasingly frequent episodes EXAM: CT HEAD WITHOUT CONTRAST TECHNIQUE: Contiguous axial images were obtained from the base of the skull through  the vertex without intravenous contrast. COMPARISON:  None. FINDINGS: Brain: No evidence of acute infarction, hemorrhage, hydrocephalus, extra-axial collection or mass lesion/mass effect. Small, likely nonacute lacunar infarction of the right corona radiata (series 2, image 18) Vascular: No hyperdense vessel or unexpected calcification. Skull: Normal. Negative for fracture or focal lesion. Sinuses/Orbits: No acute finding. Other: None. IMPRESSION: 1. No acute intracranial pathology. 2. Small, likely nonacute lacunar infarction of the right corona radiata. 3. Consider MRI to more sensitively evaluate for acute diffusion restricting infarction if clinically suspected. These results will be called to the ordering clinician or representative by the Radiologist Assistant, and communication documented in the PACS or 12/14/2020. Electronically Signed   By: [employer] M.D.   On:  12/12/2020 14:34   MR ANGIO HEAD WO CONTRAST Result Date: 12/13/2020 CLINICAL DATA:  Neuro deficit, acute, stroke suspected EXAM: MRA HEAD WITHOUT CONTRAST TECHNIQUE: Angiographic images of the Circle of Willis were acquired using MRA technique without intravenous contrast. COMPARISON:  Concurrently performed brain MRI 12/13/2020. FINDINGS: Anterior circulation: The intracranial internal carotid arteries are patent. The M1 middle cerebral arteries are patent. No M2 proximal branch occlusion or high-grade proximal stenosis is identified. Non dominant left anterior cerebral artery. Apparent flow gap within the proximal A1 segment of the left anterior cerebral artery, which may reflect a severe near-occlusive stenosis or an occlusion with distal reconstitution via the anterior communicating artery. The anterior cerebral arteries are imaged to the proximal A5 segment level. The anterior cerebral arteries are otherwise patent to this level. Posterior circulation: The intracranial vertebral arteries are patent. Severe stenosis of the proximal  V4 left vertebral artery versus artifact. The dominant right vertebral artery is patent intracranially without stenosis. The basilar artery is patent. The posterior cerebral arteries are patent. A left posterior communicating artery is present. The right posterior communicating artery is hypoplastic or absent. Anatomic variants: As described. Impression #1 will be called to the ordering clinician or representative by the Radiologist Assistant, and communication documented in the PACS or [employer]. IMPRESSION: Apparent flow gap within the proximal A1 segment of the left anterior cerebral artery, which may reflect a severe near-occlusive stenosis or an occlusion with distal reconstitution via the anterior communicating artery. 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 Electronically Signed   By: Jackey Loge D.O.   On: 12/13/2020 12:13   MR ANGIO NECK W WO CONTRAST Result Date: 12/13/2020 CLINICAL DATA:  Neuro deficit, acute, stroke suspected. EXAM: MRA NECK WITHOUT AND WITH CONTRAST TECHNIQUE: Multiplanar and multiecho pulse sequences of the neck were obtained without and with intravenous contrast. Angiographic images of the neck were obtained using MRA technique without and with intravenous contrast. CONTRAST:  8.74mL GADAVIST GADOBUTROL 1 MMOL/ML IV SOLN COMPARISON:  No pertinent prior exams available for comparison. FINDINGS: The examination is intermittently motion degraded, limiting evaluation. Common origin of the innominate and left common carotid arteries. The common carotid and internal carotid arteries are patent within the neck without hemodynamically significant stenosis (50% or greater). Mild atherosclerotic irregularity about the carotid bifurcations bilaterally. The vertebral arteries are patent within the neck. The right vertebral artery is dominant. Multiple sites of severe stenosis within the V1 and V2 left vertebral artery. IMPRESSION: Motion  degraded examination. The common and internal carotid arteries are patent within the neck bilaterally without hemodynamically significant stenosis. Mild atherosclerotic irregularity about the carotid bifurcations, bilaterally. Vertebral arteries patent within the neck. The right vertebral artery is dominant. Multiple sites of severe stenosis within the non dominant V1 and V2 left vertebral artery. Electronically Signed   By: Jackey Loge D.O.   On: 12/13/2020 12:03   MR BRAIN WO CONTRAST Result Date: 12/13/2020 CLINICAL DATA:  Neuro deficit, acute, stroke suspected. EXAM: MRI HEAD WITHOUT CONTRAST TECHNIQUE: Multiplanar, multiecho pulse sequences of the brain and surrounding structures were obtained without intravenous contrast. COMPARISON:  Head CT 12/12/2020. FINDINGS: Brain: Cerebral volume is normal. There are patchy small acute cortical/subcortical infarcts within the medial posterior left frontal lobe and medial left parietal lobe (left ACA vascular territory). Chronic small-vessel infarcts within the right corona radiata and left basal ganglia. Background mild multifocal T2/FLAIR hyperintensity within the cerebral white matter, nonspecific but compatible with chronic small  vessel ischemic disease. No evidence of an intracranial mass. No chronic intracranial blood products. No extra-axial fluid collection. No midline shift. Vascular: Maintained flow voids within the proximal large arterial vessels. Skull and upper cervical spine: No focal suspicious marrow lesion. Sinuses/Orbits: Visualized orbits show no acute finding. Trace mucosal thickening within the bilateral ethmoid and right maxillary sinuses. IMPRESSION: Patchy small acute cortical/subcortical infarcts within the medial posterior left frontal lobe and medial left parietal lobe (left ACA vascular territory). Chronic small-vessel infarcts within the right corona radiata and left basal ganglia. Background mild chronic small-vessel ischemic changes  within the cerebral white matter. Electronically Signed   By: Jackey Loge D.O.   On: 12/13/2020 11:58   MR BRAIN W CONTRAST Result Date: 12/14/2020 CLINICAL DATA:  Demyelinating disease. Evaluate demyelinating disease. EXAM: MRI HEAD WITH CONTRAST Sagittal T1 weighted postcontrast sequence, sagittal T2 weighted postcontrast sequence, axial T1 weighted postcontrast sequence, coronal TECHNIQUE: Multiplanar, multiecho pulse sequences of the brain and surrounding structures were obtained with intravenous contrast. CONTRAST:  23mL GADAVIST GADOBUTROL 1 MMOL/ML IV SOLN COMPARISON:  Brain MRI 12/13/2020. FINDINGS: A contrast-enhanced brain MRI was performed as a follow-up to the recent noncontrast brain MRI of 12/13/2020. The following sequences were acquired on today's exam: Axial, coronal and sagittal T1 weighted postcontrast sequences, sagittal T2 weighted postcontrast sequence. There is patchy cortically based enhancement within the medial mid-to-posterior left frontal lobe(ACA vascular territory). This is likely secondary to the presence of recent infarcts at these sites. Additional punctate focus of abnormal enhancement within the left temporoparietal junction (series 11, image 31) (series 12, image 10) (series 13, image 19). IMPRESSION: Patchy cortically-based enhancement within the medial mid-to-posterior left frontal lobe (ACA vascular territory). This is likely secondary to the presence of recent infarcts at these sites. There is an additional punctate focus of abnormal enhancement within the left temporoparietal junction. This may also reflect a recent cortical infarct. However, short interval 4-week contrast-enhanced brain MRI follow-up is recommended to ensure unremarkable evolution of these findings and to exclude alternative etiologies (such as primary CNS neoplasm or intracranial metastatic disease). Electronically Signed   By: Jackey Loge D.O.   On: 12/14/2020 13:29   MR CERVICAL SPINE WO  CONTRAST Result Date: 12/13/2020 CLINICAL DATA:  Cervical radiculopathy EXAM: MRI CERVICAL SPINE WITHOUT CONTRAST TECHNIQUE: Multiplanar, multisequence MR imaging of the cervical spine was performed. No intravenous contrast was administered. COMPARISON:  None. FINDINGS: Technical Note: Despite efforts by the technologist and patient, motion artifact is present on today's exam and could not be eliminated. This reduces exam sensitivity and specificity. Alignment: Straightening of the cervical lordosis without significant listhesis. Vertebrae: No fracture, evidence of discitis, or bone lesion. The C2 and C3 levels are partially fused. Cord: No definite cord signal abnormality is evident within the limitations of this motion degraded exam. Posterior Fossa, vertebral arteries, paraspinal tissues: Please see dedicated concurrent MRI of the brain for evaluation of the posterior fossa structures. No significant abnormality is evident within the paravertebral soft tissues. Disc levels: C2-C3: No significant disc protrusion, foraminal stenosis, or canal stenosis. C3-C4: Shallow left paracentral disc protrusion. Mild left facet arthropathy. Borderline-mild canal stenosis. No significant foraminal stenosis. C4-C5: No definite disc protrusion. Bilateral facet arthropathy. No significant foraminal or canal stenosis. C5-C6: No definite disc protrusion. Mild facet arthropathy. No foraminal or canal stenosis. C6-C7: Shallow central disc protrusion. Mild bilateral facet arthropathy. No foraminal or canal stenosis. C7-T1: No significant disc protrusion, foraminal stenosis, or canal stenosis. IMPRESSION: 1. Motion degraded exam. 2. Mild  degenerative changes of the cervical spine with borderline-mild canal stenosis at C3-C4. No appreciable foraminal stenosis at any level. Electronically Signed   By: Duanne Guess D.O.   On: 12/13/2020 12:00   MR CERVICAL SPINE W CONTRAST Result Date: 12/14/2020 CLINICAL DATA:  Demyelinating  disease. EXAM: MRI CERVICAL SPINE WITH CONTRAST TECHNIQUE: Multiplanar, multisequence MR imaging of the cervical spine was performed following the administration of intravenous contrast. COMPARISON:  MRI of the cervical spine 12/13/2020. FINDINGS: A contrast-enhanced MRI of the cervical spine was performed as a follow-up to the recent prior noncontrast cervical spine MRI of 12/13/2020. The following sequences were acquired on today's exam: Sagittal T2 TSE sequence, axial T1 weighted precontrast sequence, sagittal T1 weighted postcontrast sequence, axial T1 weighted postcontrast sequence. No enhancing lesions are identified within the cervical spinal cord. IMPRESSION: No enhancing lesions are identified within the cervical spinal cord. Electronically Signed   By: Jackey Loge D.O.   On: 12/14/2020 13:32   VAS [place] TRANSCRANIAL DOPPLER W BUBBLES Result Date: 12/15/2020  Transcranial Doppler with Bubble Patient Name:  Mary Fritz  Date of Exam:   12/14/2020 Medical Rec #: [id number]         Accession #:    [id number] Date of Birth: 20-Apr-1960        Patient Gender: F Patient Age:   60 years Exam Location:  [facility] Procedure:      VAS [place] TRANSCRANIAL DOPPLER W BUBBLES Referring Phys: Arnetha Courser --------------------------------------------------------------------------------  Indications: Stroke. Comparison Study: No prior studies. Performing Technologist: Jean Rosenthal RDMS, RVT  Examination Guidelines: A complete evaluation includes B-mode imaging, spectral Doppler, color Doppler, and power Doppler as needed of all accessible portions of each vessel. Bilateral testing is considered an integral part of a complete examination. Limited examinations for reoccurring indications may be performed as noted.  Summary:  A vascular evaluation was performed. The left middle cerebral artery was studied. An IV was inserted into the patient's left forearm. Verbal informed consent was obtained.  Less than 10 high  intensity transient signals (HITS) were heard at rest and with valsalva, indicating a spencer grade 1 patent foramen ovale (PFO). Positive TCD Bubble study indicative of a small right to left shunt *See table(s) above for TCD measurements and observations.  Diagnosing physician: Delia Heady MD Electronically signed by Delia Heady MD on 12/15/2020 at 12:45:39 PM.    Final     12-lead ECG SR All prior EKG's in EPIC reviewed with no documented atrial fibrillation  Telemetry SR  Assessment and Plan:  1. Cryptogenic stroke The patient presents with cryptogenic stroke.  The patient has a TEE AND LE venous dopplers planned for this today.  I spoke at length with the aid of video translation with the patient and her son Elita Quick at bedside  about monitoring for afib with either a 30 day event monitor or an implantable loop recorder.  Risks, benefits, and alteratives to implantable loop recorder were discussed with the patient today.   At this time, the patient is very clear in their decision to proceed with implantable loop recorder.   Wound care was reviewed with the patient (keep incision clean and dry for 3 days).  Wound check follow up will be scheduled for the patient.  Please call with questions.   Jarry Manon Norberto Sorenson, PA-C 12/16/2020

## 2020-12-16 NOTE — Progress Notes (Signed)
Pt back on unit. Loop recorder in place. VS wnL. No neuro changes. Son bedside. Awaiting on MD and translator. Call bell placed within reach. Will continue to monitor and maintain safety.

## 2020-12-16 NOTE — Progress Notes (Signed)
STROKE TEAM PROGRESS NOTE   INTERVAL HISTORY Patient is sitting up in bed comfortably.  Husband is at bedside.  TEE shows no clot or definite cardiac source of embolism but was positive for small right to left shunt.  Lower extremity venous Dopplers are negative for DVT.  Loop recorder is pending.  She has no new complaints.  Vital signs stable.  Neurological exam unchanged.    Vitals:   12/16/20 1037 12/16/20 1047 12/16/20 1057 12/16/20 1212  BP: (!) 105/52 117/63 (!) 129/55 134/61  Pulse: 77 67 68 64  Resp: 15 14 15 15   Temp:    (!) 97.4 F (36.3 C)  TempSrc:    Oral  SpO2: 97% 91% 99% 98%  Weight:      Height:       CBC:  Recent Labs  Lab 12/12/20 1748 12/13/20 0559  WBC 10.6* 8.3  NEUTROABS 6.2  --   HGB 14.4 13.2  HCT 43.6 39.4  MCV 87.7 89.3  PLT 317 292   Basic Metabolic Panel:  Recent Labs  Lab 12/12/20 1748 12/13/20 0559  NA 138 137  K 3.9 4.0  CL 103 104  CO2 25 26  GLUCOSE 88 109*  BUN 12 10  CREATININE 0.67 0.74  CALCIUM 9.9 9.0    Lipid Panel:  Recent Labs  Lab 12/13/20 0559  CHOL 227*  TRIG 122  HDL 42  CHOLHDL 5.4  VLDL 24  LDLCALC 12/15/20*    HgbA1c:  Recent Labs  Lab 12/13/20 0559  HGBA1C 5.7*   Urine Drug Screen:  Recent Labs  Lab 12/13/20 1545  LABOPIA NONE DETECTED  COCAINSCRNUR NONE DETECTED  LABBENZ NONE DETECTED  AMPHETMU NONE DETECTED  THCU NONE DETECTED  LABBARB POSITIVE*    Alcohol Level No results for input(s): ETH in the last 168 hours.  IMAGING past 24 hours ECHO TEE  Result Date: 12/16/2020    TRANSESOPHOGEAL ECHO REPORT   Patient Name:   Mary Fritz Date of Exam: 12/16/2020 Medical Rec #:  12/18/2020        Height:       60.0 in Accession #:    [id number]       Weight:       187.6 lb Date of Birth:  06/30/1960       BSA:          1.817 m Patient Age:    59 years         BP:           105/52 mmHg Patient Gender: F                HR:           78 bpm. Exam Location:  Inpatient Procedure: 2D Echo, Cardiac  Doppler and Color Doppler Indications:     Stroke  History:         Patient has prior history of Echocardiogram examinations, most                  recent 12/14/2020.  Sonographer:     12/16/2020 RDCS Referring Phys:  [id number] RHONDA G BARRETT Diagnosing Phys: [id number] MD PROCEDURE: After discussion of the risks and benefits of a TEE, an informed consent was obtained from the patient. The transesophogeal probe was passed without difficulty through the esophogus of the patient. Local oropharyngeal anesthetic was provided with Cetacaine. Sedation performed by different physician. The patient was monitored while under deep sedation. Anesthestetic sedation was provided  intravenously by Anesthesiology: 234mg  of Propofol. The patient's vital signs; including heart rate, blood  pressure, and oxygen saturation; remained stable throughout the procedure. The patient developed no complications during the procedure. IMPRESSIONS  1. 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  2. Left ventricular ejection fraction, by estimation, is 60 to 65%. The left ventricle has normal function. The left ventricle has no regional wall motion abnormalities. There is mild left ventricular hypertrophy of the basal-septal segment.  3. Right ventricular systolic function is normal. The right ventricular size is normal.  4. No left atrial/left atrial appendage thrombus was detected.  5. The mitral valve is normal in structure. No evidence of mitral valve regurgitation.  6. The aortic valve is tricuspid. Aortic valve regurgitation is trivial.  7. 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  8. Agitated saline contrast bubble study was positive with shunting observed within 3-6 cardiac cycles suggestive of interatrial shunt. FINDINGS  Left Ventricle: Left ventricular ejection fraction, by estimation,  is 60 to 65%. The left ventricle has normal function. The left ventricle has no regional wall motion abnormalities. The left ventricular internal cavity size was normal in size. There is  mild left ventricular hypertrophy of the basal-septal segment. Right Ventricle: The right ventricular size is normal. Right ventricular systolic function is normal. Left Atrium: Left atrial size was normal in size. No left atrial/left atrial appendage thrombus was detected. Right Atrium: Right atrial size was normal in size. Pericardium: There is no evidence of pericardial effusion. Mitral Valve: The mitral valve is normal in structure. No evidence of mitral valve regurgitation. Tricuspid Valve: The tricuspid valve is normal in structure. Tricuspid valve regurgitation is trivial. Aortic Valve: The aortic valve is tricuspid. Aortic valve regurgitation is trivial. Pulmonic Valve: The pulmonic valve was normal in structure. Pulmonic valve regurgitation is trivial. Aorta: The aortic root is normal in size and structure. There is moderate (Grade III) plaque involving the descending aorta. IAS/Shunts: No atrial level shunt detected by color flow Doppler. Agitated saline contrast bubble study was positive with shunting observed within 3-6 cardiac cycles suggestive of interatrial shunt. 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 MD Electronically signed by Olga Millers MD Signature Date/Time: 12/16/2020/1:26:01 PM    Final    VAS 12/18/2020 LOWER EXTREMITY VENOUS (DVT)  Result Date: 12/16/2020  Lower Venous DVT Study Patient Name:  Mary Fritz  Date of Exam:   12/16/2020 Medical Rec #: 12/18/2020         Accession #:    [id number] Date of Birth: 04-28-1960        Patient Gender: F Patient Age:   40 years Exam Location:  [facility] Procedure:      VAS [facility] LOWER EXTREMITY  VENOUS (DVT) Referring Phys: Trea Carnegie --------------------------------------------------------------------------------  Indications: Edema.  Comparison Study: No prior study Performing Technologist: [place] MHA, RDMS, RVT, RDCS  Examination Guidelines: A complete evaluation includes B-mode imaging, spectral Doppler, color Doppler, and power Doppler as needed of all accessible portions of each vessel. Bilateral testing is considered an integral part of a complete examination. Limited examinations for reoccurring indications may be performed as noted. The reflux portion of the exam is performed with the patient in reverse Trendelenburg.  +---------+---------------+---------+-----------+----------+--------------+ RIGHT    CompressibilityPhasicitySpontaneityPropertiesThrombus Aging +---------+---------------+---------+-----------+----------+--------------+  CFV      Full           Yes      Yes                                 +---------+---------------+---------+-----------+----------+--------------+ SFJ      Full                                                        +---------+---------------+---------+-----------+----------+--------------+ FV Prox  Full                                                        +---------+---------------+---------+-----------+----------+--------------+ FV Mid   Full                                                        +---------+---------------+---------+-----------+----------+--------------+ FV DistalFull                                                        +---------+---------------+---------+-----------+----------+--------------+ PFV      Full                                                        +---------+---------------+---------+-----------+----------+--------------+ POP      Full           Yes      Yes                                  +---------+---------------+---------+-----------+----------+--------------+ PTV      Full                                                        +---------+---------------+---------+-----------+----------+--------------+ PERO     Full                                                        +---------+---------------+---------+-----------+----------+--------------+   +---------+---------------+---------+-----------+----------+--------------+ LEFT     CompressibilityPhasicitySpontaneityPropertiesThrombus Aging +---------+---------------+---------+-----------+----------+--------------+ CFV      Full           Yes      Yes                                 +---------+---------------+---------+-----------+----------+--------------+  SFJ      Full                                                        +---------+---------------+---------+-----------+----------+--------------+ FV Prox  Full                                                        +---------+---------------+---------+-----------+----------+--------------+ FV Mid   Full                                                        +---------+---------------+---------+-----------+----------+--------------+ FV DistalFull                                                        +---------+---------------+---------+-----------+----------+--------------+ PFV      Full                                                        +---------+---------------+---------+-----------+----------+--------------+ POP      Full           Yes      Yes                                 +---------+---------------+---------+-----------+----------+--------------+ PTV      Full                                                        +---------+---------------+---------+-----------+----------+--------------+ PERO     Full                                                         +---------+---------------+---------+-----------+----------+--------------+     Summary: RIGHT: - There is no evidence of deep vein thrombosis in the lower extremity.  - No cystic structure found in the popliteal fossa.  LEFT: - There is no evidence of deep vein thrombosis in the lower extremity.  - No cystic structure found in the popliteal fossa.  *See table(s) above for measurements and observations.    Preliminary     PHYSICAL EXAM  Pleasant middle-aged Hispanic lady overweight; no distress. Psych: Affect appropriate to situation Eyes: No scleral injection HENT: No OP obstrucion Head: Normocephalic.  Cardiovascular: Normal rate and regular rhythm.  Respiratory: Effort normal and breath sounds normal to anterior  ascultation GI: Soft.  No distension. There is no tenderness.  Skin: WDI    Neurological Examination Mental Status: Alert, oriented, thought content appropriate.  Speech fluent without evidence of aphasia. Able to follow 3 step commands without difficulty. Cranial Nerves: II: Visual fields grossly normal,  III,IV, VI: ptosis not present, extra-ocular motions intact bilaterally, pupils equal, round, reactive to light and accommodation V,VII: smile symmetric, facial light touch sensation normal bilaterally VIII: hearing normal bilaterally IX,X: uvula rises symmetrically XI: bilateral shoulder shrug XII: midline tongue extension Motor: Right : Upper extremity   5/5    Left:     Upper extremity   5/5  Lower extremity   5/5     Lower extremity   5/5 Tone and bulk:normal tone throughout; no atrophy noted Sensory: Pinprick and light touch intact throughout, bilaterally Deep Tendon Reflexes: 2+ and symmetric throughout Plantars: Right: downgoing   Left: downgoing Cerebellar: normal finger-to-nose, normal rapid alternating movements and normal heel-to-shin test Gait: normal gait and station   ASSESSMENT/PLAN Mary Fritz is a 60 y.o. female with with transient numbness  and weakness in the right side which has resolved except for mild decreased sensation to cold on right and subjective numbness on the right mid-shin to toes. MRI brain and Cspine are abnormal and will check MRI w/contrast to better evaluate for secondary process such as MS.   Stroke:  left patchy ACA infarct embolic appearing. Cryptogenic at this time, if TTE/TCD w/bubble neg, may need loop placed.  MRI  patchy Left ACA stroke, chronic small vessel disease and old lacunar infarct. Also there are multiple periventricular lesions.  MRI Cspine neg, but possible demyelination lesion seen at C3-4 MRA  no extracranial stenosis. There is Left A1 near occlusion vs severe stenosis. Possible Lt V4 stenosis.  2D Echo - EF 60 - 65%. No cardiac source of emboli identified.  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 TEE no definite cardiac source of embolism.  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 LDL 161 HgbA1c 5.7 VTE prophylaxis - lovenox    There are no active orders of the following types: Diet, Nourishments.   clopidogrel 75 mg daily prior to admission, now on aspirin 81 mg daily and clopidogrel 75 mg daily. Continue DAPT x 3 weeks and back to Plavix alone, unless Afib detected, then recommend Eliquis. Therapy recommendations:  No f/u PT or speech. HH OT recommended. Disposition:  pending  Hypertension Home meds:  none Stable Permissive hypertension (OK if < 220/120) but gradually normalize in 5-7 days Long-term BP goal normotensive  Hyperlipidemia Home meds:  none LDL 161, goal < 70 Add Lipitor 80mg  po daily  High intensity statin  Continue statin at discharge  Diabetes type II: no dx HgbA1c 5.7, goal < 7.0 CBGs No results for input(s): GLUCAP in the last 72 hours.  SSI  Other Stroke Risk Factors Obesity, Body mass index is 36.64 kg/m., BMI >/= 30 associated with increased stroke risk, recommend weight loss, diet and exercise as appropriate  Hx  stroke/TIA Small PFO unlikely to be of clinical significance attributable cause of stroke- ROPE score 5 only 34% chance that stroke is due to PFO and a 2-year recurrence of stroke/TIA risk of 7%   Recommend discharge home on aspirin and Plavix for 3 weeks followed by Plavix alone and aggressive risk factor modification.  P.o. fluids to small not of clinical significance and does not need to be closed. As ROPE score is  5 and a PFO attributable factor is low.  Discussed with patient and husband and answered questions.  Discussed with Dr. Nelson Chimes.  Greater than 50% time during this 25-minute visit was spent of counseling and coordination of care and discussion with care team and answering questions stroke and PFO  Delia Heady MD      To contact Stroke Continuity provider, please refer to [email or web address]. After hours, contact General Neurology

## 2020-12-16 NOTE — Transfer of Care (Signed)
Immediate Anesthesia Transfer of Care Note  Patient: Mary Fritz  Procedure(s) Performed: TRANSESOPHAGEAL ECHOCARDIOGRAM (TEE) BUBBLE STUDY  Patient Location: Endoscopy Unit  Anesthesia Type:MAC  Level of Consciousness: drowsy  Airway & Oxygen Therapy: Patient Spontanous Breathing  Post-op Assessment: Report given to RN and Post -op Vital signs reviewed and stable  Post vital signs: Reviewed and stable  Last Vitals:  Vitals Value Taken Time  BP    Temp    Pulse 84 12/16/20 1027  Resp 31 12/16/20 1027  SpO2 92 % 12/16/20 1027  Vitals shown include unvalidated device data.  Last Pain:  Vitals:   12/16/20 0937  TempSrc: Temporal  PainSc: 0-No pain         Complications: No notable events documented.

## 2020-12-16 NOTE — Progress Notes (Signed)
  Echocardiogram 2D Echocardiogram has been performed.  Roosvelt Maser F 12/16/2020, 10:45 AM

## 2020-12-16 NOTE — Progress Notes (Signed)
PT Cancellation Note  Patient Details Name: Mary Fritz MRN: [id number] DOB: [birth year]   Cancelled Treatment:    Reason Eval/Treat Not Completed: Patient at procedure or test/unavailable. PT attempting treatment session for second time today and pt still off unit for procedures. Will continue to follow and progress as able per POC.    [name] 12/16/2020, 1:53 PM  Conni Slipper, PT, DPT Acute Rehabilitation Services Pager: [phone] Office: [phone]

## 2020-12-16 NOTE — Progress Notes (Signed)
Bilateral lower extremity venous duplex completed. Refer to "CV Proc" under chart review to view preliminary results.  12/16/2020 12:51 PM Eula Fried., MHA, RVT, RDCS, RDMS

## 2020-12-16 NOTE — Anesthesia Postprocedure Evaluation (Signed)
Anesthesia Post Note  Patient: Mary Fritz  Procedure(s) Performed: TRANSESOPHAGEAL ECHOCARDIOGRAM (TEE) BUBBLE STUDY     Patient location during evaluation: Endoscopy Anesthesia Type: MAC Level of consciousness: awake and alert Pain management: pain level controlled Vital Signs Assessment: post-procedure vital signs reviewed and stable Respiratory status: spontaneous breathing and respiratory function stable Cardiovascular status: stable Postop Assessment: no apparent nausea or vomiting Anesthetic complications: no   No notable events documented.  Last Vitals:  Vitals:   12/16/20 1047 12/16/20 1057  BP: 117/63 (!) 129/55  Pulse: 67 68  Resp: 14 15  Temp:    SpO2: 91% 99%    Last Pain:  Vitals:   12/16/20 1057  TempSrc:   PainSc: 0-No pain                 Chana Lindstrom DANIEL

## 2020-12-16 NOTE — Progress Notes (Signed)
    Transesophageal Echocardiogram Note  Mary Fritz [id number] [birth year]  Procedure: Transesophageal Echocardiogram Indications: CVA  Procedure Details Consent: Obtained Time Out: Verified patient identification, verified procedure, site/side was marked, verified correct patient position, special equipment/implants available, Radiology Safety Procedures followed,  medications/allergies/relevent history reviewed, required imaging and test results available.  Performed  Medications:  Pt sedated by anesthesia with diprovan 234 mg IV total.   Normal LV function; trace AI, TR and PI; initial saline microcavitation study positive at 9 beats suggestive of intrapulmonary shunt; second injection positive at 5 beats suggestive of PFO.    Complications: No apparent complications Patient did tolerate procedure well.  Olga Millers, MD

## 2020-12-16 NOTE — Discharge Instructions (Signed)
Post procedure wound care instructions Keep incision clean and dry for 3 days. You can remove outer dressing tomorrow. Leave steri-strips (little pieces of tape) on until seen in the office for wound check appointment. Call the office ([phone]) for redness, drainage, swelling, or fever.  

## 2020-12-17 ENCOUNTER — Encounter (HOSPITAL_COMMUNITY): Payer: Self-pay | Admitting: Cardiology

## 2020-12-31 ENCOUNTER — Ambulatory Visit (INDEPENDENT_AMBULATORY_CARE_PROVIDER_SITE_OTHER): Payer: Commercial Managed Care - PPO

## 2020-12-31 ENCOUNTER — Other Ambulatory Visit: Payer: Self-pay

## 2020-12-31 DIAGNOSIS — I639 Cerebral infarction, unspecified: Secondary | ICD-10-CM

## 2020-12-31 LAB — CUP PACEART INCLINIC DEVICE CHECK
Date Time Interrogation Session: 20221012143924
Implantable Pulse Generator Implant Date: 20220927

## 2020-12-31 NOTE — Progress Notes (Signed)
ILR wound check in clinic. Steri strips removed. Wound well healed. Home monitor transmitting nightly. No episodes. Questions answered.  

## 2021-01-19 ENCOUNTER — Ambulatory Visit (INDEPENDENT_AMBULATORY_CARE_PROVIDER_SITE_OTHER): Payer: Commercial Managed Care - PPO

## 2021-01-19 DIAGNOSIS — I639 Cerebral infarction, unspecified: Secondary | ICD-10-CM | POA: Diagnosis not present

## 2021-01-20 LAB — CUP PACEART REMOTE DEVICE CHECK
Date Time Interrogation Session: 20221030162250
Implantable Pulse Generator Implant Date: 20220927

## 2021-01-26 NOTE — Progress Notes (Signed)
[facility] Summary Report / Loop Recorder 

## 2021-02-19 ENCOUNTER — Ambulatory Visit (INDEPENDENT_AMBULATORY_CARE_PROVIDER_SITE_OTHER): Payer: Commercial Managed Care - PPO

## 2021-02-19 DIAGNOSIS — I639 Cerebral infarction, unspecified: Secondary | ICD-10-CM | POA: Diagnosis not present

## 2021-02-23 LAB — CUP PACEART REMOTE DEVICE CHECK
Date Time Interrogation Session: 20221202162141
Implantable Pulse Generator Implant Date: 20220927

## 2021-03-03 ENCOUNTER — Ambulatory Visit: Payer: Commercial Managed Care - PPO | Admitting: Neurology

## 2021-03-03 NOTE — Progress Notes (Signed)
[facility] Summary Report / Loop Recorder 

## 2021-03-26 ENCOUNTER — Ambulatory Visit (INDEPENDENT_AMBULATORY_CARE_PROVIDER_SITE_OTHER): Payer: PRIVATE HEALTH INSURANCE

## 2021-03-26 DIAGNOSIS — I639 Cerebral infarction, unspecified: Secondary | ICD-10-CM | POA: Diagnosis not present

## 2021-03-26 LAB — CUP PACEART REMOTE DEVICE CHECK
Date Time Interrogation Session: 20230104161750
Implantable Pulse Generator Implant Date: 20220927

## 2021-04-06 NOTE — Progress Notes (Signed)
[facility] Summary Report / Loop Recorder 

## 2021-04-28 ENCOUNTER — Ambulatory Visit (INDEPENDENT_AMBULATORY_CARE_PROVIDER_SITE_OTHER): Payer: Commercial Managed Care - PPO

## 2021-04-28 DIAGNOSIS — I639 Cerebral infarction, unspecified: Secondary | ICD-10-CM

## 2021-04-28 LAB — CUP PACEART REMOTE DEVICE CHECK
Date Time Interrogation Session: 20230206230924
Implantable Pulse Generator Implant Date: 20220927

## 2021-05-01 NOTE — Progress Notes (Signed)
[facility] Summary Report / Loop Recorder 

## 2021-06-01 ENCOUNTER — Ambulatory Visit (INDEPENDENT_AMBULATORY_CARE_PROVIDER_SITE_OTHER): Payer: Commercial Managed Care - PPO

## 2021-06-01 DIAGNOSIS — I639 Cerebral infarction, unspecified: Secondary | ICD-10-CM

## 2021-06-02 LAB — CUP PACEART REMOTE DEVICE CHECK
Date Time Interrogation Session: 20230312231301
Implantable Pulse Generator Implant Date: 20220927

## 2021-06-15 NOTE — Progress Notes (Signed)
[facility] Summary Report / Loop Recorder 

## 2021-07-06 ENCOUNTER — Ambulatory Visit (INDEPENDENT_AMBULATORY_CARE_PROVIDER_SITE_OTHER): Payer: Commercial Managed Care - PPO

## 2021-07-06 DIAGNOSIS — I639 Cerebral infarction, unspecified: Secondary | ICD-10-CM

## 2021-07-07 LAB — CUP PACEART REMOTE DEVICE CHECK
Date Time Interrogation Session: 20230414230908
Implantable Pulse Generator Implant Date: 20220927

## 2021-07-23 NOTE — Progress Notes (Signed)
[facility] Summary Report / Loop Recorder 

## 2021-08-09 LAB — CUP PACEART REMOTE DEVICE CHECK
Date Time Interrogation Session: 20230517231136
Implantable Pulse Generator Implant Date: 20220927

## 2021-08-10 ENCOUNTER — Ambulatory Visit (INDEPENDENT_AMBULATORY_CARE_PROVIDER_SITE_OTHER): Payer: Commercial Managed Care - PPO

## 2021-08-10 DIAGNOSIS — I639 Cerebral infarction, unspecified: Secondary | ICD-10-CM

## 2021-08-26 NOTE — Progress Notes (Signed)
[facility] Summary Report / Loop Recorder 

## 2021-09-14 ENCOUNTER — Ambulatory Visit (INDEPENDENT_AMBULATORY_CARE_PROVIDER_SITE_OTHER): Payer: Commercial Managed Care - PPO

## 2021-09-14 DIAGNOSIS — I639 Cerebral infarction, unspecified: Secondary | ICD-10-CM

## 2021-09-15 LAB — CUP PACEART REMOTE DEVICE CHECK
Date Time Interrogation Session: 20230619231226
Implantable Pulse Generator Implant Date: 20220927

## 2021-10-09 NOTE — Progress Notes (Signed)
[facility] Summary Report / Loop Recorder 

## 2021-10-15 LAB — CUP PACEART REMOTE DEVICE CHECK
Date Time Interrogation Session: 20230722230435
Implantable Pulse Generator Implant Date: 20220927

## 2021-10-19 ENCOUNTER — Ambulatory Visit (INDEPENDENT_AMBULATORY_CARE_PROVIDER_SITE_OTHER): Payer: PRIVATE HEALTH INSURANCE

## 2021-10-19 DIAGNOSIS — I639 Cerebral infarction, unspecified: Secondary | ICD-10-CM

## 2021-11-19 ENCOUNTER — Ambulatory Visit (INDEPENDENT_AMBULATORY_CARE_PROVIDER_SITE_OTHER): Payer: PRIVATE HEALTH INSURANCE

## 2021-11-19 DIAGNOSIS — I639 Cerebral infarction, unspecified: Secondary | ICD-10-CM | POA: Diagnosis not present

## 2021-11-19 LAB — CUP PACEART REMOTE DEVICE CHECK
Date Time Interrogation Session: 20230830231509
Implantable Pulse Generator Implant Date: 20220927

## 2021-11-22 NOTE — Progress Notes (Signed)
[facility] Summary Report / Loop Recorder 

## 2021-12-10 NOTE — Progress Notes (Signed)
[facility] Summary Report / Loop Recorder 

## 2021-12-22 ENCOUNTER — Ambulatory Visit (INDEPENDENT_AMBULATORY_CARE_PROVIDER_SITE_OTHER): Payer: Commercial Managed Care - PPO

## 2021-12-22 DIAGNOSIS — I639 Cerebral infarction, unspecified: Secondary | ICD-10-CM

## 2021-12-22 LAB — CUP PACEART REMOTE DEVICE CHECK
Date Time Interrogation Session: 20231002231623
Implantable Pulse Generator Implant Date: 20220927

## 2022-01-04 NOTE — Progress Notes (Signed)
[facility] Summary Report / Loop Recorder 

## 2022-01-25 ENCOUNTER — Ambulatory Visit (INDEPENDENT_AMBULATORY_CARE_PROVIDER_SITE_OTHER): Payer: Commercial Managed Care - PPO

## 2022-01-25 DIAGNOSIS — I639 Cerebral infarction, unspecified: Secondary | ICD-10-CM

## 2022-01-27 LAB — CUP PACEART REMOTE DEVICE CHECK
Date Time Interrogation Session: 20231104231240
Implantable Pulse Generator Implant Date: 20220927

## 2022-03-01 ENCOUNTER — Ambulatory Visit (INDEPENDENT_AMBULATORY_CARE_PROVIDER_SITE_OTHER): Payer: PRIVATE HEALTH INSURANCE

## 2022-03-01 DIAGNOSIS — I639 Cerebral infarction, unspecified: Secondary | ICD-10-CM

## 2022-03-02 LAB — CUP PACEART REMOTE DEVICE CHECK
Date Time Interrogation Session: 20231210232010
Implantable Pulse Generator Implant Date: 20220927

## 2022-03-03 NOTE — Progress Notes (Signed)
[facility] Summary Report / Loop Recorder 

## 2022-04-05 ENCOUNTER — Ambulatory Visit (INDEPENDENT_AMBULATORY_CARE_PROVIDER_SITE_OTHER): Payer: Commercial Managed Care - PPO

## 2022-04-05 DIAGNOSIS — I639 Cerebral infarction, unspecified: Secondary | ICD-10-CM

## 2022-04-07 LAB — CUP PACEART REMOTE DEVICE CHECK
Date Time Interrogation Session: 20240112231532
Implantable Pulse Generator Implant Date: 20220927

## 2022-04-08 NOTE — Progress Notes (Signed)
[facility] Summary Report / Loop Recorder

## 2022-05-09 LAB — CUP PACEART REMOTE DEVICE CHECK
Date Time Interrogation Session: 20240214231438
Implantable Pulse Generator Implant Date: 20220927

## 2022-05-10 ENCOUNTER — Ambulatory Visit (INDEPENDENT_AMBULATORY_CARE_PROVIDER_SITE_OTHER): Payer: Commercial Managed Care - PPO

## 2022-05-10 DIAGNOSIS — I639 Cerebral infarction, unspecified: Secondary | ICD-10-CM

## 2022-05-24 NOTE — Progress Notes (Signed)
[facility] Summary Report / Loop Recorder 

## 2022-06-14 ENCOUNTER — Ambulatory Visit (INDEPENDENT_AMBULATORY_CARE_PROVIDER_SITE_OTHER): Payer: PRIVATE HEALTH INSURANCE

## 2022-06-14 DIAGNOSIS — I639 Cerebral infarction, unspecified: Secondary | ICD-10-CM | POA: Diagnosis not present

## 2022-06-14 LAB — CUP PACEART REMOTE DEVICE CHECK
Date Time Interrogation Session: 20240324232043
Implantable Pulse Generator Implant Date: 20220927

## 2022-06-23 NOTE — Progress Notes (Signed)
[facility] Summary Report / Loop Recorder 

## 2022-07-16 ENCOUNTER — Ambulatory Visit (INDEPENDENT_AMBULATORY_CARE_PROVIDER_SITE_OTHER): Payer: PRIVATE HEALTH INSURANCE

## 2022-07-16 DIAGNOSIS — I639 Cerebral infarction, unspecified: Secondary | ICD-10-CM

## 2022-07-19 LAB — CUP PACEART REMOTE DEVICE CHECK
Date Time Interrogation Session: 20240426230623
Implantable Pulse Generator Implant Date: 20220927

## 2022-07-27 NOTE — Progress Notes (Signed)
[facility] Summary Report / Loop Recorder 

## 2022-08-10 NOTE — Progress Notes (Signed)
[facility] Summary Report / Loop Recorder 

## 2022-08-18 ENCOUNTER — Ambulatory Visit (INDEPENDENT_AMBULATORY_CARE_PROVIDER_SITE_OTHER): Payer: PRIVATE HEALTH INSURANCE

## 2022-08-18 DIAGNOSIS — I639 Cerebral infarction, unspecified: Secondary | ICD-10-CM

## 2022-08-19 LAB — CUP PACEART REMOTE DEVICE CHECK
Date Time Interrogation Session: 20240529231411
Implantable Pulse Generator Implant Date: 20220927

## 2022-09-09 NOTE — Progress Notes (Signed)
[facility] Summary Report / Loop Recorder 

## 2022-09-20 ENCOUNTER — Ambulatory Visit (INDEPENDENT_AMBULATORY_CARE_PROVIDER_SITE_OTHER): Payer: PRIVATE HEALTH INSURANCE

## 2022-09-20 DIAGNOSIS — I639 Cerebral infarction, unspecified: Secondary | ICD-10-CM

## 2022-09-21 LAB — CUP PACEART REMOTE DEVICE CHECK
Date Time Interrogation Session: 20240701231059
Implantable Pulse Generator Implant Date: 20220927

## 2022-10-11 NOTE — Progress Notes (Signed)
[facility] Summary Report / Loop Recorder 

## 2022-10-24 LAB — CUP PACEART REMOTE DEVICE CHECK
Date Time Interrogation Session: 20240803230559
Implantable Pulse Generator Implant Date: 20220927

## 2022-10-25 ENCOUNTER — Ambulatory Visit: Payer: PRIVATE HEALTH INSURANCE

## 2022-10-25 DIAGNOSIS — I639 Cerebral infarction, unspecified: Secondary | ICD-10-CM | POA: Diagnosis not present

## 2022-11-09 NOTE — Progress Notes (Signed)
[facility] Summary Report / Loop Recorder 

## 2022-11-29 ENCOUNTER — Ambulatory Visit: Payer: PRIVATE HEALTH INSURANCE

## 2022-11-29 DIAGNOSIS — I639 Cerebral infarction, unspecified: Secondary | ICD-10-CM

## 2022-11-29 LAB — CUP PACEART REMOTE DEVICE CHECK
Date Time Interrogation Session: 20240905230802
Implantable Pulse Generator Implant Date: 20220927

## 2022-12-16 NOTE — Progress Notes (Signed)
[facility] Summary Report / Loop Recorder 

## 2023-01-03 ENCOUNTER — Ambulatory Visit (INDEPENDENT_AMBULATORY_CARE_PROVIDER_SITE_OTHER): Payer: PRIVATE HEALTH INSURANCE

## 2023-01-03 DIAGNOSIS — I639 Cerebral infarction, unspecified: Secondary | ICD-10-CM | POA: Diagnosis not present

## 2023-01-03 LAB — CUP PACEART REMOTE DEVICE CHECK
Date Time Interrogation Session: 20241013231720
Implantable Pulse Generator Implant Date: 20220927

## 2023-01-19 NOTE — Progress Notes (Signed)
[facility] Summary Report / Loop Recorder 

## 2023-02-07 ENCOUNTER — Ambulatory Visit (INDEPENDENT_AMBULATORY_CARE_PROVIDER_SITE_OTHER): Payer: PRIVATE HEALTH INSURANCE

## 2023-02-07 DIAGNOSIS — I639 Cerebral infarction, unspecified: Secondary | ICD-10-CM

## 2023-02-07 LAB — CUP PACEART REMOTE DEVICE CHECK
Date Time Interrogation Session: 20241117231331
Implantable Pulse Generator Implant Date: 20220927

## 2023-03-04 NOTE — Progress Notes (Signed)
[facility] Summary Report / Loop Recorder 

## 2023-03-14 ENCOUNTER — Ambulatory Visit (INDEPENDENT_AMBULATORY_CARE_PROVIDER_SITE_OTHER): Payer: PRIVATE HEALTH INSURANCE

## 2023-03-14 DIAGNOSIS — I639 Cerebral infarction, unspecified: Secondary | ICD-10-CM | POA: Diagnosis not present

## 2023-03-15 LAB — CUP PACEART REMOTE DEVICE CHECK
Date Time Interrogation Session: 20241222230900
Implantable Pulse Generator Implant Date: 20220927

## 2023-04-18 ENCOUNTER — Ambulatory Visit: Payer: PRIVATE HEALTH INSURANCE

## 2023-04-18 DIAGNOSIS — I639 Cerebral infarction, unspecified: Secondary | ICD-10-CM

## 2023-04-18 LAB — CUP PACEART REMOTE DEVICE CHECK
Date Time Interrogation Session: 20250126231127
Implantable Pulse Generator Implant Date: 20220927

## 2023-04-25 NOTE — Progress Notes (Signed)
 [facility] Summary Report / Loop Recorder

## 2023-04-25 NOTE — Addendum Note (Signed)
Addended by: Geralyn Flash D on: 04/25/2023 09:26 AM   Modules accepted: Orders

## 2023-05-23 ENCOUNTER — Ambulatory Visit: Payer: PRIVATE HEALTH INSURANCE

## 2023-05-23 DIAGNOSIS — I639 Cerebral infarction, unspecified: Secondary | ICD-10-CM | POA: Diagnosis not present

## 2023-05-25 LAB — CUP PACEART REMOTE DEVICE CHECK
Date Time Interrogation Session: 20250302230620
Implantable Pulse Generator Implant Date: 20220927

## 2023-05-27 NOTE — Progress Notes (Signed)
 [facility] Summary Report / Loop Recorder

## 2023-06-24 NOTE — Addendum Note (Signed)
 Addended by: Geralyn Flash D on: 06/24/2023 01:52 PM   Modules accepted: Orders

## 2023-06-24 NOTE — Progress Notes (Signed)
 [facility] Summary Report / Loop Recorder

## 2023-06-27 ENCOUNTER — Ambulatory Visit (INDEPENDENT_AMBULATORY_CARE_PROVIDER_SITE_OTHER): Payer: PRIVATE HEALTH INSURANCE

## 2023-06-27 DIAGNOSIS — I639 Cerebral infarction, unspecified: Secondary | ICD-10-CM

## 2023-06-28 LAB — CUP PACEART REMOTE DEVICE CHECK
Date Time Interrogation Session: 20250406230710
Implantable Pulse Generator Implant Date: 20220927

## 2023-08-01 ENCOUNTER — Ambulatory Visit (INDEPENDENT_AMBULATORY_CARE_PROVIDER_SITE_OTHER): Payer: PRIVATE HEALTH INSURANCE

## 2023-08-01 DIAGNOSIS — I639 Cerebral infarction, unspecified: Secondary | ICD-10-CM

## 2023-08-02 LAB — CUP PACEART REMOTE DEVICE CHECK
Date Time Interrogation Session: 20250511233936
Implantable Pulse Generator Implant Date: 20220927

## 2023-08-07 ENCOUNTER — Ambulatory Visit: Payer: Self-pay | Admitting: Cardiology

## 2023-08-16 NOTE — Progress Notes (Signed)
 [facility] Summary Report / Loop Recorder

## 2023-08-16 NOTE — Addendum Note (Signed)
 Addended by: Edra Govern D on: 08/16/2023 04:31 PM   Modules accepted: Orders

## 2023-09-01 ENCOUNTER — Ambulatory Visit: Payer: PRIVATE HEALTH INSURANCE

## 2023-09-01 DIAGNOSIS — I639 Cerebral infarction, unspecified: Secondary | ICD-10-CM

## 2023-09-01 LAB — CUP PACEART REMOTE DEVICE CHECK
Date Time Interrogation Session: 20250611231238
Implantable Pulse Generator Implant Date: 20220927

## 2023-09-02 ENCOUNTER — Ambulatory Visit: Payer: Self-pay | Admitting: Cardiology

## 2023-09-20 NOTE — Addendum Note (Signed)
 Addended by: TAWNI DRILLING D on: 09/20/2023 09:43 AM   Modules accepted: Orders

## 2023-09-20 NOTE — Progress Notes (Signed)
 [facility] Summary Report / Loop Recorder

## 2023-10-03 ENCOUNTER — Ambulatory Visit (INDEPENDENT_AMBULATORY_CARE_PROVIDER_SITE_OTHER): Payer: PRIVATE HEALTH INSURANCE

## 2023-10-03 DIAGNOSIS — I639 Cerebral infarction, unspecified: Secondary | ICD-10-CM

## 2023-10-04 ENCOUNTER — Ambulatory Visit: Payer: Self-pay | Admitting: Cardiology

## 2023-10-04 LAB — CUP PACEART REMOTE DEVICE CHECK
Date Time Interrogation Session: 20250713232441
Implantable Pulse Generator Implant Date: 20220927

## 2023-10-21 NOTE — Progress Notes (Signed)
 [facility] Summary Report / Loop Recorder

## 2023-11-03 ENCOUNTER — Ambulatory Visit (INDEPENDENT_AMBULATORY_CARE_PROVIDER_SITE_OTHER): Payer: PRIVATE HEALTH INSURANCE

## 2023-11-03 DIAGNOSIS — I639 Cerebral infarction, unspecified: Secondary | ICD-10-CM

## 2023-11-03 LAB — CUP PACEART REMOTE DEVICE CHECK
Date Time Interrogation Session: 20250813231951
Implantable Pulse Generator Implant Date: 20220927

## 2023-11-04 ENCOUNTER — Ambulatory Visit: Payer: Self-pay | Admitting: Cardiology

## 2023-12-05 ENCOUNTER — Ambulatory Visit (INDEPENDENT_AMBULATORY_CARE_PROVIDER_SITE_OTHER): Payer: PRIVATE HEALTH INSURANCE

## 2023-12-05 DIAGNOSIS — I639 Cerebral infarction, unspecified: Secondary | ICD-10-CM

## 2023-12-06 LAB — CUP PACEART REMOTE DEVICE CHECK
Date Time Interrogation Session: 20250913230913
Implantable Pulse Generator Implant Date: 20220927

## 2023-12-08 ENCOUNTER — Ambulatory Visit: Payer: Self-pay | Admitting: Cardiology

## 2023-12-12 NOTE — Progress Notes (Signed)
 Remote Loop Recorder Transmission

## 2023-12-14 NOTE — Progress Notes (Signed)
 Remote Loop Recorder Transmission

## 2023-12-29 NOTE — Progress Notes (Signed)
 Remote Loop Recorder Transmission

## 2024-01-06 ENCOUNTER — Ambulatory Visit (INDEPENDENT_AMBULATORY_CARE_PROVIDER_SITE_OTHER): Payer: Self-pay

## 2024-01-06 DIAGNOSIS — I639 Cerebral infarction, unspecified: Secondary | ICD-10-CM

## 2024-01-08 LAB — CUP PACEART REMOTE DEVICE CHECK
Date Time Interrogation Session: 20251016231825
Implantable Pulse Generator Implant Date: 20220927

## 2024-01-09 ENCOUNTER — Ambulatory Visit: Payer: Self-pay | Admitting: Cardiology

## 2024-01-12 NOTE — Progress Notes (Signed)
 Remote Loop Recorder Transmission

## 2024-02-06 ENCOUNTER — Ambulatory Visit: Payer: Self-pay

## 2024-02-06 DIAGNOSIS — I639 Cerebral infarction, unspecified: Secondary | ICD-10-CM

## 2024-02-06 LAB — CUP PACEART REMOTE DEVICE CHECK
Date Time Interrogation Session: 20251116232131
Implantable Pulse Generator Implant Date: 20220927

## 2024-02-08 ENCOUNTER — Ambulatory Visit: Payer: Self-pay | Admitting: Cardiology

## 2024-02-08 NOTE — Progress Notes (Signed)
 Remote Loop Recorder Transmission

## 2024-03-08 ENCOUNTER — Ambulatory Visit: Payer: Self-pay

## 2024-03-08 DIAGNOSIS — I639 Cerebral infarction, unspecified: Secondary | ICD-10-CM

## 2024-03-08 LAB — CUP PACEART REMOTE DEVICE CHECK
Date Time Interrogation Session: 20251217231909
Implantable Pulse Generator Implant Date: 20220927

## 2024-03-12 NOTE — Progress Notes (Signed)
 Remote Loop Recorder Transmission

## 2024-03-19 ENCOUNTER — Ambulatory Visit: Payer: Self-pay | Admitting: Cardiology

## 2024-04-08 ENCOUNTER — Ambulatory Visit: Payer: Self-pay

## 2024-04-08 DIAGNOSIS — I639 Cerebral infarction, unspecified: Secondary | ICD-10-CM

## 2024-04-09 LAB — CUP PACEART REMOTE DEVICE CHECK
Date Time Interrogation Session: 20260117232305
Implantable Pulse Generator Implant Date: 20220927

## 2024-04-10 ENCOUNTER — Ambulatory Visit: Payer: Self-pay | Admitting: Cardiology

## 2024-04-12 ENCOUNTER — Other Ambulatory Visit: Payer: Self-pay | Admitting: Family Medicine

## 2024-04-12 DIAGNOSIS — Z1231 Encounter for screening mammogram for malignant neoplasm of breast: Secondary | ICD-10-CM

## 2024-04-13 NOTE — Progress Notes (Signed)
 Remote Loop Recorder Transmission
